# Patient Record
Sex: Female | Born: 1980 | Race: White | Hispanic: No | Marital: Married | Smoking: Former smoker
Health system: Southern US, Community
[De-identification: ages and names within clinical notes are randomized; demographics above are authoritative.]

## PROBLEM LIST (undated history)

## (undated) ENCOUNTER — Inpatient Hospital Stay (HOSPITAL_COMMUNITY): Payer: Self-pay

## (undated) DIAGNOSIS — Q059 Spina bifida, unspecified: Secondary | ICD-10-CM

## (undated) DIAGNOSIS — I10 Essential (primary) hypertension: Secondary | ICD-10-CM

## (undated) HISTORY — DX: Spina bifida, unspecified: Q05.9

---

## 2000-09-29 ENCOUNTER — Emergency Department (HOSPITAL_COMMUNITY): Admission: EM | Admit: 2000-09-29 | Discharge: 2000-09-29 | Payer: Self-pay | Admitting: Emergency Medicine

## 2003-10-14 ENCOUNTER — Other Ambulatory Visit: Admission: RE | Admit: 2003-10-14 | Discharge: 2003-10-14 | Payer: Self-pay | Admitting: Obstetrics and Gynecology

## 2004-03-24 ENCOUNTER — Ambulatory Visit: Payer: Self-pay | Admitting: Gastroenterology

## 2004-04-07 ENCOUNTER — Inpatient Hospital Stay (HOSPITAL_COMMUNITY): Admission: AD | Admit: 2004-04-07 | Discharge: 2004-04-07 | Payer: Self-pay | Admitting: Obstetrics and Gynecology

## 2004-04-27 ENCOUNTER — Inpatient Hospital Stay (HOSPITAL_COMMUNITY): Admission: AD | Admit: 2004-04-27 | Discharge: 2004-04-29 | Payer: Self-pay | Admitting: Obstetrics and Gynecology

## 2004-04-30 ENCOUNTER — Encounter: Admission: RE | Admit: 2004-04-30 | Discharge: 2004-05-30 | Payer: Self-pay | Admitting: Obstetrics and Gynecology

## 2005-12-27 ENCOUNTER — Inpatient Hospital Stay (HOSPITAL_COMMUNITY): Admission: AD | Admit: 2005-12-27 | Discharge: 2005-12-28 | Payer: Self-pay | Admitting: Obstetrics and Gynecology

## 2005-12-28 ENCOUNTER — Inpatient Hospital Stay (HOSPITAL_COMMUNITY): Admission: AD | Admit: 2005-12-28 | Discharge: 2005-12-28 | Payer: Self-pay | Admitting: Obstetrics and Gynecology

## 2006-01-25 ENCOUNTER — Inpatient Hospital Stay (HOSPITAL_COMMUNITY): Admission: AD | Admit: 2006-01-25 | Discharge: 2006-01-25 | Payer: Self-pay | Admitting: Obstetrics & Gynecology

## 2006-02-02 ENCOUNTER — Inpatient Hospital Stay (HOSPITAL_COMMUNITY): Admission: RE | Admit: 2006-02-02 | Discharge: 2006-02-04 | Payer: Self-pay | Admitting: *Deleted

## 2009-06-10 ENCOUNTER — Ambulatory Visit (HOSPITAL_COMMUNITY): Admission: RE | Admit: 2009-06-10 | Discharge: 2009-06-10 | Payer: Self-pay | Admitting: Psychiatry

## 2009-06-16 ENCOUNTER — Other Ambulatory Visit (HOSPITAL_COMMUNITY): Admission: RE | Admit: 2009-06-16 | Discharge: 2009-06-28 | Payer: Self-pay | Admitting: Psychiatry

## 2009-06-22 ENCOUNTER — Ambulatory Visit: Payer: Self-pay | Admitting: Psychiatry

## 2010-08-26 NOTE — Discharge Summary (Signed)
NAMEEBANY, BOWERMASTER                ACCOUNT NO.:  [id number]   MEDICAL RECORD NO.:  [id number]          PATIENT TYPE:  MAT   LOCATION:  [facility]                          FACILITY:  [facility]   PHYSICIAN:  Richardean Sale, M.D.   DATE OF BIRTH:  May 08, 1980   DATE OF ADMISSION:  12/27/2005  DATE OF DISCHARGE:                                 DISCHARGE SUMMARY   CHIEF COMPLAINT:  Contractions.   HISTORY OF PRESENT ILLNESS:  This is a 30 year old gravida 2 para 1, white  female who is at [redacted] weeks gestation with a due date of February 07, 2006,  whose pregnancy, up until this point, has been uncomplicated. She notes the  onset of contractions at approximately 4:00 p.m. today that were occurring  every 5 minutes. She denied any vaginal bleeding, loss of fluid, and  reported good fetal movement.   PAST OBSTETRIC HISTORY:  Significant for hypertension and she was induced at  37-1/2 weeks. Prenatal care has been on [facility] with Dr. Earlene Plater as  primary attending. Vaginal deliver at 37-1/2 weeks, induced for mild pre-  eclampsia.   GYNECOLOGIC HISTORY:  History of cervical dysplasia. No treatment needed. No  other STD's.   PAST MEDICAL HISTORY:  No prior hospitalizations except pregnancy, history  of migraines.   PAST SURGICAL HISTORY:  None.   SOCIAL HISTORY:  She is a former smoker. Smokes occasionally this pregnancy.  Denies any illicit drug use.   FAMILY HISTORY:  Positive for heart disease, hypertension, and emphysema. No  congenital birth defects.   MEDICATIONS:  Prenatal vitamins and Benadryl p.r.n.   ALLERGIES:  NO KNOWN DRUG ALLERGIES.   PHYSICAL EXAMINATION:  VITAL SIGNS:  She is afebrile. Vital signs are  stable.  LUNGS:  Respirations unlabored.  HEART:  Pulse regular.  GENERAL:  A well developed, well nourished female in no acute distress.  ABDOMEN:  Gravida, soft, non-tender.  PELVIC:  Cervix is 2 to 3 cm dilated, 60% effaced, minus 2 station.  Ballotable, vertex.   LABORATORY DATA:  Urine shows 40 ketones, many squamous epithelial cells.  White count of 12.7. Hemoglobin and hematocrit 11.9 and 34. Platelets are  230,000. Fetal fibronectin on admission was negative. Group B beta strep is  pending.   Fetal heart rate tracing is reactive. Tocometer tracing shows contractions  initially every 2 minutes, then spaced out every 2 to 5 minutes, since  administration of Procardia. Cervix has been unchanged since arrival at MAU  at approximately 6:00 p.m.   ASSESSMENT:  A 30 year old gravida 2, para 1, white female at [redacted] weeks  gestation with pre-term contractions.   PLAN:  The patient has received 2 doses of Procardia with only slight  decrease in the frequency of her contractions. The patient states  contractions now are 3 out of 10 on the pain scale. Previously, they were 6  out of 10. Will administer a dose of terbutaline and monitor for another  hour. If contractions continue despite terbutaline, will admit overnight for  observation. A group beta strep is pending. She has been given 1 dose of  ampicillin 2 grams IV. We will continue if she is admitted overnight. Given  that she is only [redacted] weeks gestation, she has received 1 dose of beta  Methasone tonight and will plan to repeat in 24 hours. If patient's  contractions decrease in frequency, will plan to send her home with followup  in the office in 24 to 48 hours.      Richardean Sale, M.D.  Electronically Signed     JW/MEDQ  D:  12/27/2005  T:  12/29/2005  Job:  [id number]

## 2012-04-10 NOTE — L&D Delivery Note (Signed)
Delivery Note After a 5  Minute 2nd stage, At 3:01 AM a viable female was delivered via Vaginal, Spontaneous Delivery (Presentation: ; Occiput Anterior).  APGAR: 9/9; weight pending Placenta status: Intact, Spontaneous.  Cord: 3 vessels with the following complications: None.    Anesthesia: Epidural  Episiotomy: None Lacerations: 2nd degree;Perineal Suture Repair: 2.0 vicryl Est. Blood Loss (mL): 150  Mom to postpartum.  Baby to nursery-stable.  Delivery and repair by [name] Other, DO under my supervision  CRESENZO-DISHMAN,FRANCES 09/13/2012, 3:27 AM

## 2012-06-26 ENCOUNTER — Emergency Department (HOSPITAL_COMMUNITY)
Admission: EM | Admit: 2012-06-26 | Discharge: 2012-06-26 | Disposition: A | Discharge status: Other Acute Inpatient Hospital | Payer: Self-pay

## 2012-06-26 ENCOUNTER — Encounter (HOSPITAL_COMMUNITY): Payer: Self-pay

## 2012-06-26 ENCOUNTER — Inpatient Hospital Stay (HOSPITAL_COMMUNITY)
Admission: AD | Admit: 2012-06-26 | Discharge: 2012-06-26 | Disposition: A | Discharge status: Home / Self Care | Payer: Medicaid Other | Source: Ambulatory Visit | Attending: Obstetrics & Gynecology | Admitting: Obstetrics & Gynecology

## 2012-06-26 DIAGNOSIS — Z3482 Encounter for supervision of other normal pregnancy, second trimester: Secondary | ICD-10-CM

## 2012-06-26 DIAGNOSIS — O99891 Other specified diseases and conditions complicating pregnancy: Secondary | ICD-10-CM | POA: Insufficient documentation

## 2012-06-26 DIAGNOSIS — R030 Elevated blood-pressure reading, without diagnosis of hypertension: Secondary | ICD-10-CM | POA: Insufficient documentation

## 2012-06-26 DIAGNOSIS — N949 Unspecified condition associated with female genital organs and menstrual cycle: Secondary | ICD-10-CM | POA: Insufficient documentation

## 2012-06-26 DIAGNOSIS — O0930 Supervision of pregnancy with insufficient antenatal care, unspecified trimester: Secondary | ICD-10-CM | POA: Insufficient documentation

## 2012-06-26 HISTORY — DX: Essential (primary) hypertension: I10

## 2012-06-26 LAB — COMPREHENSIVE METABOLIC PANEL
ALT: 8 U/L (ref 0–35)
AST: 15 U/L (ref 0–37)
BUN: 10 mg/dL (ref 6–23)
CO2: 24 mEq/L (ref 19–32)
Calcium: 8.8 mg/dL (ref 8.4–10.5)
Chloride: 100 mEq/L (ref 96–112)
Creatinine, Ser: 0.47 mg/dL — ABNORMAL LOW (ref 0.50–1.10)
GFR calc Af Amer: >90 mL/min (ref >90)
GFR calc non Af Amer: >90 mL/min (ref >90)
Glucose, Bld: 99 mg/dL (ref 70–99)
Potassium: 3.7 mEq/L (ref 3.5–5.1)
Sodium: 134 mEq/L — ABNORMAL LOW (ref 135–145)
Total Bilirubin: 0.2 mg/dL — ABNORMAL LOW (ref 0.3–1.2)
Total Protein: 6.6 g/dL (ref 6.0–8.3)

## 2012-06-26 LAB — CBC
HCT: 36.3 % (ref 36.0–46.0)
Hemoglobin: 12.1 g/dL (ref 12.0–15.0)
MCH: 29 pg (ref 26.0–34.0)
MCHC: 33.3 g/dL (ref 30.0–36.0)
MCV: 87.1 fL (ref 78.0–100.0)
Platelets: 240 10*3/uL (ref 150–400)
RBC: 4.17 MIL/uL (ref 3.87–5.11)
WBC: 10.5 10*3/uL (ref 4.0–10.5)

## 2012-06-26 LAB — DIFFERENTIAL
Basophils Absolute: 0 10*3/uL (ref 0.0–0.1)
Basophils Relative: 0 % (ref 0–1)
Eosinophils Absolute: 0.1 10*3/uL (ref 0.0–0.7)
Eosinophils Relative: 1 % (ref 0–5)
Lymphocytes Relative: 17 % (ref 12–46)
Lymphs Abs: 1.8 10*3/uL (ref 0.7–4.0)
Monocytes Relative: 7 % (ref 3–12)
Neutro Abs: 7.9 10*3/uL — ABNORMAL HIGH (ref 1.7–7.7)
Neutrophils Relative %: 75 % (ref 43–77)

## 2012-06-26 LAB — RAPID URINE DRUG SCREEN, HOSP PERFORMED
Amphetamines: NOT DETECTED
Barbiturates: NOT DETECTED
Benzodiazepines: NOT DETECTED
Cocaine: NOT DETECTED
Tetrahydrocannabinol: NOT DETECTED

## 2012-06-26 LAB — ABO/RH: ABO/RH(D): A POS

## 2012-06-26 LAB — TYPE AND SCREEN
ABO/RH(D): A POS
Antibody Screen: NEGATIVE

## 2012-06-26 LAB — PROTEIN / CREATININE RATIO, URINE
Creatinine, Urine: 193.28 mg/dL
Protein Creatinine Ratio: 0.12 (ref 0.00–0.15)

## 2012-06-26 LAB — RAPID HIV SCREEN (WH-MAU): Rapid HIV Screen: NONREACTIVE

## 2012-06-26 MED ORDER — LABETALOL HCL 100 MG PO TABS
200.0000 mg | ORAL_TABLET | Freq: Once | ORAL | Status: AC
  Administered 2012-06-26: 200 mg via ORAL
  Filled 2012-06-26: qty 2

## 2012-06-26 MED ORDER — LABETALOL HCL 100 MG PO TABS: 100.0000 mg | ORAL_TABLET | Freq: Two times a day (BID) | ORAL | Status: DC

## 2012-06-26 NOTE — MAU Note (Signed)
Patient states she had a positive pregnancy test in January at [facility] and has applied for [facility]. States she has some vaginal discharge in the am but does not continue to leak during the day. Is not having pain or bleeding. Patient is concerned about not having prenatal care. Has felt fetal movement.

## 2012-06-26 NOTE — MAU Note (Signed)
Patient is taking Effexor and has slowly weaned herself off but continued to take some. Had a MMR vaccine in September and is concerned about taking it and becoming pregnant. Patient had gestational hypertension with the first pregnancy and had IOL at 37 weeks.

## 2012-06-26 NOTE — MAU Provider Note (Signed)
History     CSN: [id number]  Arrival date and time: 06/26/12 1732   None     Chief Complaint  Patient presents with  . Vaginal Discharge   HPI 32 y/o G3P2002 here with no pre-natal care without major complaint. She states that her LMP was about 8 months ago but that she has had very irregular periods since she had an IUD removed last year. She has Hx of gestational HTN or pre-eclampsia in a previous pregnancy when she was induced at 37 weeks. She denies vaginal discharge/bleeding, decreased fetal movements, and contractions. 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   She has had a period of crampy abd pain followed by back pain suspicious for a contraction last week but that has not recurred. She also notes some possible leakage of fluid for approx 1 month. She denies any gushes of fluid and has not needed to use a pad during this time. She says the fluid is clear and runs down her legs at times. She's also had some intermittent dyspnea with activity that is not alarming to her. It happens with vigorous exercise and returns to baseline easily when she rests. She also has occasional RUQ pain. She does have occasional migraine headaches which is normal for her and relieved with tylenol.   She denies other medical problems except for possible chronic hypertension. She says taht at a physical in September it was elevated but she doesn't recall the numbers. She denies headache, scotoma, chest pain, and edema.    OB History   Grav Para Term Preterm Abortions TAB SAB Ect Mult Living   3 2 2       2       Past Medical History  Diagnosis Date  . Hypertension     1st pregnancy for preeclampsia    No past surgical history on file.  No family history on file.  History  Substance Use Topics  . Smoking status: Never Smoker   . Smokeless tobacco: Not on file  . Alcohol Use: No    Allergies: Allergies not on  file  No prescriptions prior to admission    ROS Per HPI Physical Exam   Blood pressure 140/101, pulse 108, temperature 98.5 F (36.9 C), temperature source Oral, resp. rate 16, height 5\' 5"  (1.651 m), weight 73.029 kg (161 lb), SpO2 100.00%.  Physical Exam Physical Exam  Gen: NAD, alert, cooperative with exam HEENT: NCAT, MMM CV: RRR, good S1/S2, no murmur Resp: CTABL, no wheezes, non-labored Abd: Soft pregnant abdomen, fundus measuring 27 cm, non-tender to palpation Ext: No edema Neuro: Alert and oriented, No gross deficits Spec exam without pooling or blood, cervix closed.   FHT: baseline: 135, moderate variability, accels present, no decels Toco: no contractions seen   MAU Course  Procedures  Results for orders placed during the hospital encounter of 06/26/12 (from the past 24 hour(s))  URINE RAPID DRUG SCREEN (HOSP PERFORMED)     Status: None   Collection Time    06/26/12  6:00 PM      Result Value Range   Opiates NONE DETECTED  NONE DETECTED   Cocaine NONE DETECTED  NONE DETECTED   Benzodiazepines NONE DETECTED  NONE DETECTED   Amphetamines NONE DETECTED  NONE DETECTED   Tetrahydrocannabinol NONE DETECTED  NONE DETECTED   Barbiturates NONE DETECTED  NONE DETECTED  PROTEIN / CREATININE RATIO, URINE  Status: None   Collection Time    06/26/12  6:00 PM      Result Value Range   Creatinine, Urine 193.28     Total Protein, Urine 23.1     PROTEIN CREATININE RATIO 0.12  0.00 - 0.15  COMPREHENSIVE METABOLIC PANEL     Status: Abnormal   Collection Time    06/26/12  6:53 PM      Result Value Range   Sodium 134 (*) 135 - 145 mEq/L   Potassium 3.7  3.5 - 5.1 mEq/L   Chloride 100  96 - 112 mEq/L   CO2 24  19 - 32 mEq/L   Glucose, Bld 99  70 - 99 mg/dL   BUN 10  6 - 23 mg/dL   Creatinine, Ser 0.98 (*) 0.50 - 1.10 mg/dL   Calcium 8.8  8.4 - 11.9 mg/dL   Total Protein 6.6  6.0 - 8.3 g/dL   Albumin 2.6 (*) 3.5 - 5.2 g/dL   AST 15  0 - 37 U/L   ALT 8  0 - 35 U/L    Alkaline Phosphatase 95  39 - 117 U/L   Total Bilirubin 0.2 (*) 0.3 - 1.2 mg/dL   GFR calc non Af Amer >90  >90 mL/min   GFR calc Af Amer >90  >90 mL/min  CBC     Status: None   Collection Time    06/26/12  6:53 PM      Result Value Range   WBC 10.5  4.0 - 10.5 K/uL   RBC 4.17  3.87 - 5.11 MIL/uL   Hemoglobin 12.1  12.0 - 15.0 g/dL   HCT 14.7  82.9 - 56.2 %   MCV 87.1  78.0 - 100.0 fL   MCH 29.0  26.0 - 34.0 pg   MCHC 33.3  30.0 - 36.0 g/dL   RDW 13.0  86.5 - 78.4 %   Platelets 240  150 - 400 K/uL  DIFFERENTIAL     Status: Abnormal   Collection Time    06/26/12  6:53 PM      Result Value Range   Neutrophils Relative 75  43 - 77 %   Neutro Abs 7.9 (*) 1.7 - 7.7 K/uL   Lymphocytes Relative 17  12 - 46 %   Lymphs Abs 1.8  0.7 - 4.0 K/uL   Monocytes Relative 7  3 - 12 %   Monocytes Absolute 0.7  0.1 - 1.0 K/uL   Eosinophils Relative 1  0 - 5 %   Eosinophils Absolute 0.1  0.0 - 0.7 K/uL   Basophils Relative 0  0 - 1 %   Basophils Absolute 0.0  0.0 - 0.1 K/uL  TYPE AND SCREEN     Status: None   Collection Time    06/26/12  6:53 PM      Result Value Range   ABO/RH(D) A POS     Antibody Screen NEG     Sample Expiration 06/29/2012    RAPID HIV SCREEN [facility]     Status: None   Collection Time    06/26/12  6:53 PM      Result Value Range   SUDS Rapid HIV Screen NON REACTIVE  NON REACTIVE  ABO/RH     Status: None   Collection Time    06/26/12  6:55 PM      Result Value Range   ABO/RH(D) A POS       Assessment and Plan  32 y/o G3P2002 here  with no pre-natal care at approximately 27 weeks by fundal height - She presents with elevated blood pressures questionable for chronic hypertension. PIH labs obatined and are negative (h/o pre-eclampsia) - Complete OB US ordered OP - OB panel, UDS, CBC, CMP, GCC urine collected and negative - Message sent to schedule f/u in [facility]  Kevin Fenton 06/26/2012, 6:53 PM  Evaluation and management procedures were performed by Resident  physician under my supervision/collaboration. Chart reviewed, patient examined by me and I agree with management and plan. Filed Vitals:   06/26/12 2016 06/26/12 2031 06/26/12 2046 06/26/12 2104  BP: 128/90 120/85 115/78 112/73  Pulse: 84 90 79 83  Temp:      TempSrc:      Resp: 20   20  Height:      Weight:      SpO2:

## 2012-06-27 LAB — GC/CHLAMYDIA PROBE AMP
CT Probe RNA: NEGATIVE
GC Probe RNA: NEGATIVE

## 2012-06-27 LAB — RPR: RPR Ser Ql: NONREACTIVE

## 2012-06-27 LAB — HEPATITIS B SURFACE ANTIGEN: Hepatitis B Surface Ag: NEGATIVE

## 2012-06-28 LAB — RUBELLA SCREEN: Rubella: 3.99 Index — ABNORMAL HIGH (ref <0.90)

## 2012-07-02 ENCOUNTER — Ambulatory Visit (HOSPITAL_COMMUNITY)
Admission: RE | Admit: 2012-07-02 | Discharge: 2012-07-02 | Disposition: A | Discharge status: Home / Self Care | Payer: Medicaid Other | Source: Ambulatory Visit | Attending: Obstetrics & Gynecology | Admitting: Obstetrics & Gynecology

## 2012-07-02 DIAGNOSIS — Z3689 Encounter for other specified antenatal screening: Secondary | ICD-10-CM | POA: Insufficient documentation

## 2012-07-02 DIAGNOSIS — O132 Gestational [pregnancy-induced] hypertension without significant proteinuria, second trimester: Secondary | ICD-10-CM

## 2012-07-02 DIAGNOSIS — Z3482 Encounter for supervision of other normal pregnancy, second trimester: Secondary | ICD-10-CM

## 2012-07-02 DIAGNOSIS — O0930 Supervision of pregnancy with insufficient antenatal care, unspecified trimester: Secondary | ICD-10-CM | POA: Insufficient documentation

## 2012-07-11 ENCOUNTER — Encounter: Payer: Self-pay | Admitting: Family

## 2012-07-11 ENCOUNTER — Ambulatory Visit (INDEPENDENT_AMBULATORY_CARE_PROVIDER_SITE_OTHER): Payer: Self-pay | Admitting: Family

## 2012-07-11 ENCOUNTER — Other Ambulatory Visit: Payer: Self-pay | Admitting: Family

## 2012-07-11 VITALS — BP 123/82 | Temp 97.3°F | Wt 160.9 lb

## 2012-07-11 DIAGNOSIS — O0990 Supervision of high risk pregnancy, unspecified, unspecified trimester: Secondary | ICD-10-CM | POA: Insufficient documentation

## 2012-07-11 DIAGNOSIS — O0930 Supervision of pregnancy with insufficient antenatal care, unspecified trimester: Secondary | ICD-10-CM

## 2012-07-11 DIAGNOSIS — O0933 Supervision of pregnancy with insufficient antenatal care, third trimester: Secondary | ICD-10-CM

## 2012-07-11 DIAGNOSIS — O10019 Pre-existing essential hypertension complicating pregnancy, unspecified trimester: Secondary | ICD-10-CM

## 2012-07-11 DIAGNOSIS — Z3493 Encounter for supervision of normal pregnancy, unspecified, third trimester: Secondary | ICD-10-CM

## 2012-07-11 DIAGNOSIS — O10913 Unspecified pre-existing hypertension complicating pregnancy, third trimester: Secondary | ICD-10-CM

## 2012-07-11 DIAGNOSIS — Z3480 Encounter for supervision of other normal pregnancy, unspecified trimester: Secondary | ICD-10-CM

## 2012-07-11 DIAGNOSIS — O139 Gestational [pregnancy-induced] hypertension without significant proteinuria, unspecified trimester: Secondary | ICD-10-CM | POA: Insufficient documentation

## 2012-07-11 LAB — POCT URINALYSIS DIP (DEVICE)
Bilirubin Urine: NEGATIVE
Glucose, UA: NEGATIVE mg/dL
Hgb urine dipstick: NEGATIVE
Ketones, ur: NEGATIVE mg/dL
Leukocytes, UA: NEGATIVE
Nitrite: NEGATIVE
Protein, ur: 30 mg/dL — AB
Specific Gravity, Urine: 1.02 (ref 1.005–1.030)
Urobilinogen, UA: 0.2 mg/dL (ref 0.0–1.0)
pH: 7 (ref 5.0–8.0)

## 2012-07-11 MED ORDER — ONDANSETRON 8 MG PO TBDP: 8.0000 mg | ORAL_TABLET | Freq: Three times a day (TID) | ORAL | Status: DC | PRN

## 2012-07-11 NOTE — Progress Notes (Signed)
New OB visit; reviewed practice information and OB visit schedule; too late for genetic screening; seen in MAU and put on labetalol for elevated blood pressures.  History of preeclampsia in 1st pregnancy.  Reviewed ultrasound results (LV echogenic focus).  Pt will collect 24 hour urine and return for prenatal labs on Monday.   Pap smear collected today. Exam   BP 123/82  Temp(Src) 97.3 F (36.3 C)  Wt 160 lb 14.4 oz (72.984 kg)  BMI 26.78 kg/m2  LMP 10/03/2011 Uterine Size: size equals dates  Pelvic Exam:    Perineum: No Hemorrhoids, Normal Perineum   Vulva: normal   Vagina:  normal mucosa, normal discharge, no palpable nodules   pH: Not done   Cervix: no bleeding following Pap, no cervical motion tenderness and no lesions   Adnexa: normal adnexa and no mass, fullness, tenderness   Bony Pelvis: Adequate  System: Breast:  No nipple retraction or dimpling, No nipple discharge or bleeding, No axillary or supraclavicular adenopathy, Normal to palpation without dominant masses   Skin: normal coloration and turgor, no rashes    Neurologic: negative   Extremities: normal strength, tone, and muscle mass   HEENT neck supple with midline trachea and thyroid without masses   Mouth/Teeth mucous membranes moist, pharynx normal without lesions   Neck supple and no masses   Cardiovascular: regular rate and rhythm, no murmurs or gallops   Respiratory:  appears well, vitals normal, no respiratory distress, acyanotic, normal RR, neck free of mass or lymphadenopathy, chest clear, no wheezing, crepitations, rhonchi, normal symmetric air entry   Abdomen: soft, non-tender; bowel sounds normal; no masses,  no organomegaly   Urinary: urethral meatus normal

## 2012-07-13 ENCOUNTER — Encounter: Payer: Self-pay | Admitting: Family

## 2012-07-13 LAB — CULTURE, OB URINE

## 2012-07-15 ENCOUNTER — Other Ambulatory Visit: Payer: Self-pay

## 2012-07-15 LAB — COMPREHENSIVE METABOLIC PANEL
ALT: 8 U/L (ref 0–35)
Alkaline Phosphatase: 94 U/L (ref 39–117)
CO2: 28 mEq/L (ref 19–32)
Creat: 0.55 mg/dL (ref 0.50–1.10)
Total Bilirubin: 0.4 mg/dL (ref 0.3–1.2)

## 2012-07-15 NOTE — Addendum Note (Signed)
Addended by: Franchot Mimes on: 07/15/2012 03:40 PM   Modules accepted: Orders

## 2012-07-16 LAB — CREATININE CLEARANCE, URINE, 24 HOUR
Creatinine Clearance: 161 mL/min — ABNORMAL HIGH (ref 75–115)
Creatinine, Urine: 145.9 mg/dL
Creatinine: 0.55 mg/dL (ref 0.50–1.10)

## 2012-07-16 LAB — PROTEIN, URINE, 24 HOUR: Protein, 24H Urine: 70 mg/d (ref 50–100)

## 2012-07-17 ENCOUNTER — Encounter: Payer: Self-pay | Admitting: Family

## 2012-07-25 ENCOUNTER — Encounter: Payer: Self-pay | Admitting: Obstetrics and Gynecology

## 2012-08-01 ENCOUNTER — Encounter: Payer: Self-pay | Admitting: Obstetrics & Gynecology

## 2012-08-08 ENCOUNTER — Ambulatory Visit (INDEPENDENT_AMBULATORY_CARE_PROVIDER_SITE_OTHER): Payer: Medicaid Other | Admitting: Obstetrics & Gynecology

## 2012-08-08 VITALS — BP 124/81 | Temp 97.9°F | Wt 169.7 lb

## 2012-08-08 DIAGNOSIS — O0993 Supervision of high risk pregnancy, unspecified, third trimester: Secondary | ICD-10-CM

## 2012-08-08 DIAGNOSIS — O10019 Pre-existing essential hypertension complicating pregnancy, unspecified trimester: Secondary | ICD-10-CM

## 2012-08-08 DIAGNOSIS — O10913 Unspecified pre-existing hypertension complicating pregnancy, third trimester: Secondary | ICD-10-CM

## 2012-08-08 LAB — POCT URINALYSIS DIP (DEVICE)
Bilirubin Urine: NEGATIVE
Glucose, UA: NEGATIVE mg/dL
Hgb urine dipstick: NEGATIVE
Specific Gravity, Urine: <=1.005 (ref 1.005–1.030)
Urobilinogen, UA: 0.2 mg/dL (ref 0.0–1.0)

## 2012-08-08 NOTE — Progress Notes (Signed)
Pt had NR NST @ 1330 w/FHR variables. She was advised of the importance of continued EFM for further evaluation. She stated that she could not stay because she needed to pick up her children from school and husband from work. She agreed to return later today for repeat NST.  Pt returned @ 1600 and had reactive NST.

## 2012-08-08 NOTE — Progress Notes (Signed)
P= c/o trace edema in ankles only, pelvic pressure, states hasn't taken bp medicine yet today but took while at check in. Declines flu shot.

## 2012-08-08 NOTE — Patient Instructions (Signed)

## 2012-08-08 NOTE — Progress Notes (Signed)
On labetalol 100 mg BID, c/o pain and pressure, no UC, good FM. BP noted, labs were wnl.  Ned to start testing 2/ week, have f/u US

## 2012-08-09 NOTE — Progress Notes (Signed)
08/08/12 reactive NST

## 2012-08-12 ENCOUNTER — Other Ambulatory Visit: Payer: Self-pay

## 2012-08-12 ENCOUNTER — Ambulatory Visit (HOSPITAL_COMMUNITY): Payer: Medicaid Other | Attending: Obstetrics & Gynecology

## 2012-08-14 ENCOUNTER — Encounter: Payer: Self-pay | Admitting: *Deleted

## 2012-08-15 ENCOUNTER — Ambulatory Visit (INDEPENDENT_AMBULATORY_CARE_PROVIDER_SITE_OTHER): Payer: Medicaid Other | Admitting: Obstetrics & Gynecology

## 2012-08-15 VITALS — BP 133/85 | Temp 97.0°F | Wt 169.3 lb

## 2012-08-15 DIAGNOSIS — O10019 Pre-existing essential hypertension complicating pregnancy, unspecified trimester: Secondary | ICD-10-CM

## 2012-08-15 DIAGNOSIS — O10913 Unspecified pre-existing hypertension complicating pregnancy, third trimester: Secondary | ICD-10-CM

## 2012-08-15 DIAGNOSIS — O10013 Pre-existing essential hypertension complicating pregnancy, third trimester: Secondary | ICD-10-CM

## 2012-08-15 LAB — POCT URINALYSIS DIP (DEVICE)
Bilirubin Urine: NEGATIVE
Hgb urine dipstick: NEGATIVE
Ketones, ur: NEGATIVE mg/dL
pH: 5.5 (ref 5.0–8.0)

## 2012-08-15 MED ORDER — LABETALOL HCL 100 MG PO TABS: 100.0000 mg | ORAL_TABLET | Freq: Three times a day (TID) | ORAL | Status: DC

## 2012-08-15 MED ORDER — PANTOPRAZOLE SODIUM 40 MG PO TBEC: 40.0000 mg | DELAYED_RELEASE_TABLET | Freq: Every day | ORAL | Status: DC

## 2012-08-15 NOTE — Progress Notes (Signed)
Pt missed appt on 5/5 for follow up US growth- rescheduled to 08/19/12.

## 2012-08-15 NOTE — Progress Notes (Signed)
P= 82,  

## 2012-08-15 NOTE — Patient Instructions (Signed)

## 2012-08-15 NOTE — Progress Notes (Signed)
Nausea and occasional vomiting but denies reflux. Will try increasing labetalol as well to TID  NST reaactive today, UC Q 3-4 min, cs closed

## 2012-08-19 ENCOUNTER — Ambulatory Visit (HOSPITAL_COMMUNITY)
Admission: RE | Admit: 2012-08-19 | Discharge: 2012-08-19 | Disposition: A | Discharge status: Home / Self Care | Payer: Medicaid Other | Source: Ambulatory Visit | Attending: Obstetrics & Gynecology | Admitting: Obstetrics & Gynecology

## 2012-08-19 ENCOUNTER — Ambulatory Visit (INDEPENDENT_AMBULATORY_CARE_PROVIDER_SITE_OTHER): Payer: Medicaid Other | Admitting: *Deleted

## 2012-08-19 VITALS — BP 130/83

## 2012-08-19 DIAGNOSIS — O10013 Pre-existing essential hypertension complicating pregnancy, third trimester: Secondary | ICD-10-CM

## 2012-08-19 DIAGNOSIS — Z3689 Encounter for other specified antenatal screening: Secondary | ICD-10-CM | POA: Insufficient documentation

## 2012-08-19 DIAGNOSIS — O10019 Pre-existing essential hypertension complicating pregnancy, unspecified trimester: Secondary | ICD-10-CM

## 2012-08-19 DIAGNOSIS — O0993 Supervision of high risk pregnancy, unspecified, third trimester: Secondary | ICD-10-CM

## 2012-08-19 DIAGNOSIS — O139 Gestational [pregnancy-induced] hypertension without significant proteinuria, unspecified trimester: Secondary | ICD-10-CM | POA: Insufficient documentation

## 2012-08-19 NOTE — Progress Notes (Signed)
P = 80   [place] for growth today

## 2012-08-19 NOTE — Progress Notes (Signed)
NST performed today was reviewed and was found to be reactive.  Continue recommended antenatal testing and prenatal care.  

## 2012-08-22 ENCOUNTER — Ambulatory Visit (INDEPENDENT_AMBULATORY_CARE_PROVIDER_SITE_OTHER): Payer: Medicaid Other | Admitting: Obstetrics & Gynecology

## 2012-08-22 VITALS — BP 123/83 | Temp 98.7°F | Wt 171.6 lb

## 2012-08-22 DIAGNOSIS — O10019 Pre-existing essential hypertension complicating pregnancy, unspecified trimester: Secondary | ICD-10-CM

## 2012-08-22 LAB — POCT URINALYSIS DIP (DEVICE)
Bilirubin Urine: NEGATIVE
Hgb urine dipstick: NEGATIVE
Nitrite: NEGATIVE
Protein, ur: 30 mg/dL — AB
pH: 6 (ref 5.0–8.0)

## 2012-08-22 MED ORDER — AMLODIPINE BESYLATE 5 MG PO TABS: 5.0000 mg | ORAL_TABLET | Freq: Every day | ORAL | Status: DC

## 2012-08-22 NOTE — Patient Instructions (Addendum)

## 2012-08-22 NOTE — Progress Notes (Signed)
NST reactive, irregular contractions  Nausea and emesis, seems to be related to her labetalol dose. Headache as well. Will switch to Norvasc 5 mg daily,

## 2012-08-22 NOTE — Progress Notes (Signed)
Pulse- 80 

## 2012-08-26 ENCOUNTER — Other Ambulatory Visit: Payer: Medicaid Other

## 2012-08-29 ENCOUNTER — Encounter: Payer: Self-pay | Admitting: Obstetrics and Gynecology

## 2012-08-29 ENCOUNTER — Ambulatory Visit (INDEPENDENT_AMBULATORY_CARE_PROVIDER_SITE_OTHER): Payer: Medicaid Other | Admitting: Obstetrics and Gynecology

## 2012-08-29 VITALS — BP 131/87 | Wt 171.9 lb

## 2012-08-29 DIAGNOSIS — O10013 Pre-existing essential hypertension complicating pregnancy, third trimester: Secondary | ICD-10-CM

## 2012-08-29 DIAGNOSIS — O0930 Supervision of pregnancy with insufficient antenatal care, unspecified trimester: Secondary | ICD-10-CM

## 2012-08-29 DIAGNOSIS — O10019 Pre-existing essential hypertension complicating pregnancy, unspecified trimester: Secondary | ICD-10-CM

## 2012-08-29 DIAGNOSIS — O0993 Supervision of high risk pregnancy, unspecified, third trimester: Secondary | ICD-10-CM

## 2012-08-29 DIAGNOSIS — O0933 Supervision of pregnancy with insufficient antenatal care, third trimester: Secondary | ICD-10-CM

## 2012-08-29 LAB — POCT URINALYSIS DIP (DEVICE)
Nitrite: NEGATIVE
Protein, ur: 30 mg/dL — AB

## 2012-08-29 LAB — OB RESULTS CONSOLE GC/CHLAMYDIA: Chlamydia: NEGATIVE

## 2012-08-29 NOTE — Progress Notes (Signed)
NST reviewed and reactive. Patient doing well without complaints. BP ok today. FM/PTL precautions reviewed. Cultures collected today

## 2012-08-29 NOTE — Progress Notes (Signed)
P = 88   Pt missed appt on 5/19 for fetal testing due to car trouble. States she lost her mucous plug recently and having low abd pain- ? UC's.  Pt denies H/A or visual disturbances today and reports less nausea than last week.  Pt states she has been weaning herself off of effexor because she heard that it was bad for the baby.

## 2012-09-01 ENCOUNTER — Encounter: Payer: Self-pay | Admitting: Obstetrics and Gynecology

## 2012-09-01 LAB — CULTURE, BETA STREP (GROUP B ONLY)

## 2012-09-03 ENCOUNTER — Ambulatory Visit (INDEPENDENT_AMBULATORY_CARE_PROVIDER_SITE_OTHER): Payer: Medicaid Other | Admitting: *Deleted

## 2012-09-03 VITALS — BP 126/87

## 2012-09-03 DIAGNOSIS — O10013 Pre-existing essential hypertension complicating pregnancy, third trimester: Secondary | ICD-10-CM

## 2012-09-03 DIAGNOSIS — O10019 Pre-existing essential hypertension complicating pregnancy, unspecified trimester: Secondary | ICD-10-CM

## 2012-09-03 NOTE — Progress Notes (Signed)
P = 92 

## 2012-09-05 ENCOUNTER — Encounter: Payer: Self-pay | Admitting: Obstetrics & Gynecology

## 2012-09-05 ENCOUNTER — Ambulatory Visit (INDEPENDENT_AMBULATORY_CARE_PROVIDER_SITE_OTHER): Payer: Medicaid Other | Admitting: Obstetrics & Gynecology

## 2012-09-05 VITALS — BP 131/89 | Wt 176.2 lb

## 2012-09-05 DIAGNOSIS — O139 Gestational [pregnancy-induced] hypertension without significant proteinuria, unspecified trimester: Secondary | ICD-10-CM

## 2012-09-05 DIAGNOSIS — O133 Gestational [pregnancy-induced] hypertension without significant proteinuria, third trimester: Secondary | ICD-10-CM

## 2012-09-05 LAB — POCT URINALYSIS DIP (DEVICE)
Bilirubin Urine: NEGATIVE
Glucose, UA: NEGATIVE mg/dL
Hgb urine dipstick: NEGATIVE
Ketones, ur: NEGATIVE mg/dL
Nitrite: NEGATIVE
Protein, ur: NEGATIVE mg/dL
Specific Gravity, Urine: >=1.03 (ref 1.005–1.030)
Urobilinogen, UA: 0.2 mg/dL (ref 0.0–1.0)
pH: 6 (ref 5.0–8.0)

## 2012-09-05 NOTE — Progress Notes (Signed)
P-100 

## 2012-09-05 NOTE — Progress Notes (Signed)
NST reactive. Continue twice a week testing. Contractions usually at least 5-6x each hour every hour but able to talk through them and sleep through them. No other complaints. Compliant with norvasc and BP well controlled today. Continues to have same headaches has had throughout pregnancy (top of the head). No blurry vision, RUQ pain, shortness of breath. Patient requesting induction but discussed waiting until 39 weeks if BP remains well controlled. Weight gain likely related to recent memorial day weekend and some high salt foods.

## 2012-09-05 NOTE — Progress Notes (Signed)
Pt is concerned about having a 5 lb weight gain in 1 week, however she has had total weight gain of 6 lbs over 4 weeks.

## 2012-09-05 NOTE — Progress Notes (Signed)
NST reviewed and reactive.  Kara Conner, M.D., FACOG    

## 2012-09-05 NOTE — Patient Instructions (Signed)

## 2012-09-09 ENCOUNTER — Ambulatory Visit (INDEPENDENT_AMBULATORY_CARE_PROVIDER_SITE_OTHER): Payer: Medicaid Other | Admitting: *Deleted

## 2012-09-09 VITALS — BP 137/88 | Wt 175.7 lb

## 2012-09-09 DIAGNOSIS — O133 Gestational [pregnancy-induced] hypertension without significant proteinuria, third trimester: Secondary | ICD-10-CM

## 2012-09-09 DIAGNOSIS — O139 Gestational [pregnancy-induced] hypertension without significant proteinuria, unspecified trimester: Secondary | ICD-10-CM

## 2012-09-09 NOTE — Progress Notes (Signed)
NST reactive on 09/09/12 

## 2012-09-09 NOTE — Progress Notes (Signed)
P = 92  Pt gets H/A every night- relieved by tylenol- denies visual disturbances.

## 2012-09-11 NOTE — Progress Notes (Signed)
NST 09/03/12 reviewed and reactive 

## 2012-09-12 ENCOUNTER — Ambulatory Visit (INDEPENDENT_AMBULATORY_CARE_PROVIDER_SITE_OTHER): Payer: Medicaid Other | Admitting: Obstetrics and Gynecology

## 2012-09-12 ENCOUNTER — Encounter (HOSPITAL_COMMUNITY): Payer: Self-pay | Admitting: Anesthesiology

## 2012-09-12 ENCOUNTER — Encounter (HOSPITAL_COMMUNITY): Payer: Self-pay | Admitting: *Deleted

## 2012-09-12 ENCOUNTER — Inpatient Hospital Stay (HOSPITAL_COMMUNITY)
Admission: AD | Admit: 2012-09-12 | Discharge: 2012-09-14 | DRG: 775 | Disposition: A | Discharge status: Home / Self Care | Payer: Medicaid Other | Source: Ambulatory Visit | Attending: Obstetrics & Gynecology | Admitting: Obstetrics & Gynecology

## 2012-09-12 ENCOUNTER — Encounter: Payer: Self-pay | Admitting: Obstetrics and Gynecology

## 2012-09-12 ENCOUNTER — Inpatient Hospital Stay (HOSPITAL_COMMUNITY): Payer: Medicaid Other | Admitting: Anesthesiology

## 2012-09-12 VITALS — BP 140/98 | Temp 97.6°F | Wt 177.1 lb

## 2012-09-12 DIAGNOSIS — O0993 Supervision of high risk pregnancy, unspecified, third trimester: Secondary | ICD-10-CM

## 2012-09-12 DIAGNOSIS — O0930 Supervision of pregnancy with insufficient antenatal care, unspecified trimester: Secondary | ICD-10-CM

## 2012-09-12 DIAGNOSIS — O133 Gestational [pregnancy-induced] hypertension without significant proteinuria, third trimester: Secondary | ICD-10-CM

## 2012-09-12 DIAGNOSIS — O139 Gestational [pregnancy-induced] hypertension without significant proteinuria, unspecified trimester: Principal | ICD-10-CM | POA: Diagnosis present

## 2012-09-12 DIAGNOSIS — O0933 Supervision of pregnancy with insufficient antenatal care, third trimester: Secondary | ICD-10-CM

## 2012-09-12 LAB — RPR: RPR Ser Ql: NONREACTIVE

## 2012-09-12 LAB — PROTEIN / CREATININE RATIO, URINE
Creatinine, Urine: 164.91 mg/dL
Total Protein, Urine: 17.2 mg/dL

## 2012-09-12 LAB — POCT URINALYSIS DIP (DEVICE)
Bilirubin Urine: NEGATIVE
Nitrite: NEGATIVE
Protein, ur: 30 mg/dL — AB
Urobilinogen, UA: 0.2 mg/dL (ref 0.0–1.0)
pH: 5.5 (ref 5.0–8.0)

## 2012-09-12 LAB — CBC
HCT: 34.4 % — ABNORMAL LOW (ref 36.0–46.0)
MCHC: 32 g/dL (ref 30.0–36.0)
MCV: 83.9 fL (ref 78.0–100.0)
RDW: 14.1 % (ref 11.5–15.5)
WBC: 11.2 10*3/uL — ABNORMAL HIGH (ref 4.0–10.5)

## 2012-09-12 LAB — COMPREHENSIVE METABOLIC PANEL
Albumin: 2.7 g/dL — ABNORMAL LOW (ref 3.5–5.2)
BUN: 12 mg/dL (ref 6–23)
Chloride: 100 mEq/L (ref 96–112)
Creatinine, Ser: 0.6 mg/dL (ref 0.50–1.10)
GFR calc Af Amer: >90 mL/min (ref >90)
Total Bilirubin: 0.3 mg/dL (ref 0.3–1.2)

## 2012-09-12 MED ORDER — LIDOCAINE HCL (PF) 1 % IJ SOLN
INTRAMUSCULAR | Status: DC | PRN
  Administered 2012-09-12 (×2): 5 mL

## 2012-09-12 MED ORDER — FENTANYL 2.5 MCG/ML BUPIVACAINE 1/10 % EPIDURAL INFUSION (WH - ANES)
14.0000 mL/h | INTRAMUSCULAR | Status: DC | PRN
  Administered 2012-09-12 – 2012-09-13 (×2): 14 mL/h via EPIDURAL
  Filled 2012-09-12 (×2): qty 125

## 2012-09-12 MED ORDER — ACETAMINOPHEN 325 MG PO TABS
650.0000 mg | ORAL_TABLET | ORAL | Status: DC | PRN
  Administered 2012-09-12 – 2012-09-13 (×2): 650 mg via ORAL
  Filled 2012-09-12 (×2): qty 2

## 2012-09-12 MED ORDER — EPHEDRINE 5 MG/ML INJ
10.0000 mg | INTRAVENOUS | Status: DC | PRN
  Filled 2012-09-12: qty 2

## 2012-09-12 MED ORDER — PHENYLEPHRINE 40 MCG/ML (10ML) SYRINGE FOR IV PUSH (FOR BLOOD PRESSURE SUPPORT)
80.0000 ug | PREFILLED_SYRINGE | INTRAVENOUS | Status: DC | PRN
  Filled 2012-09-12: qty 2

## 2012-09-12 MED ORDER — AMLODIPINE BESYLATE 5 MG PO TABS
5.0000 mg | ORAL_TABLET | Freq: Every day | ORAL | Status: DC
  Administered 2012-09-12: 5 mg via ORAL
  Filled 2012-09-12: qty 1

## 2012-09-12 MED ORDER — OXYTOCIN 40 UNITS IN LACTATED RINGERS INFUSION - SIMPLE MED
1.0000 m[IU]/min | INTRAVENOUS | Status: DC
  Administered 2012-09-12: 1 m[IU]/min via INTRAVENOUS

## 2012-09-12 MED ORDER — PHENYLEPHRINE 40 MCG/ML (10ML) SYRINGE FOR IV PUSH (FOR BLOOD PRESSURE SUPPORT)
80.0000 ug | PREFILLED_SYRINGE | INTRAVENOUS | Status: DC | PRN
  Filled 2012-09-12: qty 5
  Filled 2012-09-12: qty 2

## 2012-09-12 MED ORDER — OXYTOCIN BOLUS FROM INFUSION
500.0000 mL | INTRAVENOUS | Status: DC
  Administered 2012-09-13: 500 mL via INTRAVENOUS

## 2012-09-12 MED ORDER — TERBUTALINE SULFATE 1 MG/ML IJ SOLN: 0.2500 mg | Freq: Once | INTRAMUSCULAR | Status: AC | PRN

## 2012-09-12 MED ORDER — OXYCODONE-ACETAMINOPHEN 5-325 MG PO TABS: 1.0000 | ORAL_TABLET | ORAL | Status: DC | PRN

## 2012-09-12 MED ORDER — OXYTOCIN 40 UNITS IN LACTATED RINGERS INFUSION - SIMPLE MED
62.5000 mL/h | INTRAVENOUS | Status: DC
  Filled 2012-09-12: qty 1000

## 2012-09-12 MED ORDER — DIPHENHYDRAMINE HCL 50 MG/ML IJ SOLN: 12.5000 mg | INTRAMUSCULAR | Status: DC | PRN

## 2012-09-12 MED ORDER — LACTATED RINGERS IV SOLN
INTRAVENOUS | Status: DC
  Administered 2012-09-12: 14:00:00 via INTRAVENOUS

## 2012-09-12 MED ORDER — LIDOCAINE HCL (PF) 1 % IJ SOLN
30.0000 mL | INTRAMUSCULAR | Status: DC | PRN
  Filled 2012-09-12 (×2): qty 30

## 2012-09-12 MED ORDER — ONDANSETRON HCL 4 MG/2ML IJ SOLN: 4.0000 mg | Freq: Four times a day (QID) | INTRAMUSCULAR | Status: DC | PRN

## 2012-09-12 MED ORDER — LACTATED RINGERS IV SOLN
500.0000 mL | INTRAVENOUS | Status: DC | PRN
  Administered 2012-09-13: 300 mL via INTRAVENOUS

## 2012-09-12 MED ORDER — LACTATED RINGERS IV SOLN
500.0000 mL | Freq: Once | INTRAVENOUS | Status: AC
  Administered 2012-09-12: 500 mL via INTRAVENOUS

## 2012-09-12 MED ORDER — FLEET ENEMA 7-19 GM/118ML RE ENEM: 1.0000 | ENEMA | RECTAL | Status: DC | PRN

## 2012-09-12 MED ORDER — CITRIC ACID-SODIUM CITRATE 334-500 MG/5ML PO SOLN: 30.0000 mL | ORAL | Status: DC | PRN

## 2012-09-12 MED ORDER — IBUPROFEN 600 MG PO TABS
600.0000 mg | ORAL_TABLET | Freq: Four times a day (QID) | ORAL | Status: DC | PRN
  Administered 2012-09-13: 600 mg via ORAL
  Filled 2012-09-12: qty 1

## 2012-09-12 MED ORDER — EPHEDRINE 5 MG/ML INJ
10.0000 mg | INTRAVENOUS | Status: DC | PRN
  Filled 2012-09-12: qty 2
  Filled 2012-09-12: qty 4

## 2012-09-12 NOTE — Progress Notes (Signed)
Pulse- 79 Patient reports lower abdominal pain/pressure and contractions; patient states baby isn't as active in the morning and fetal movement feels less but baby moves a lot in the evening.  Pt has H/A's every day - getting worse.

## 2012-09-12 NOTE — Anesthesia Preprocedure Evaluation (Signed)
Anesthesia Evaluation  Patient identified by MRN, date of birth, ID band Patient awake    Reviewed: Allergy & Precautions, H&P , Patient's Chart, lab work & pertinent test results  Airway Mallampati: II TM Distance: >3 FB Neck ROM: full    Dental no notable dental hx.    Pulmonary neg pulmonary ROS,  breath sounds clear to auscultation  Pulmonary exam normal       Cardiovascular hypertension, negative cardio ROS  Rhythm:regular Rate:Normal     Neuro/Psych negative neurological ROS  negative psych ROS   GI/Hepatic negative GI ROS, Neg liver ROS,   Endo/Other  negative endocrine ROS  Renal/GU negative Renal ROS     Musculoskeletal   Abdominal   Peds  Hematology negative hematology ROS (+)   Anesthesia Other Findings   Reproductive/Obstetrics (+) Pregnancy                           Anesthesia Physical Anesthesia Plan  ASA: III  Anesthesia Plan: Epidural   Post-op Pain Management:    Induction:   Airway Management Planned:   Additional Equipment:   Intra-op Plan:   Post-operative Plan:   Informed Consent: I have reviewed the patients History and Physical, chart, labs and discussed the procedure including the risks, benefits and alternatives for the proposed anesthesia with the patient or authorized representative who has indicated his/her understanding and acceptance.     Plan Discussed with:   Anesthesia Plan Comments:         Anesthesia Quick Evaluation  

## 2012-09-12 NOTE — Progress Notes (Signed)
Kara Conner is a 32 y.o. G3P2002 at [redacted]w[redacted]d by ultrasound admitted for induction of labor due to gestational HTN.  Subjective: Comfortable with epidural.  Objective: BP 126/73  Pulse 80  Temp(Src) 97.8 F (36.6 C) (Oral)  Resp 16  Ht 5\' 5"  (1.651 m)  Wt 80.287 kg (177 lb)  BMI 29.45 kg/m2  SpO2 99%  LMP 10/03/2011   Total I/O In: -  Out: 300 [Urine:300]  FHT:  FHR: 140 bpm, variability: moderate,  accelerations:  Present,  decelerations:  Absent UC:   regular, every 1-4 minutes SVE:   Dilation: 5 Effacement (%): 80 Station: Ballotable Exam by:: [employer] CNM  Labs: Lab Results  Component Value Date   WBC 11.2* 09/12/2012   HGB 11.0* 09/12/2012   HCT 34.4* 09/12/2012   MCV 83.9 09/12/2012   PLT 191 09/12/2012    Assessment / Plan: Induction of labor due to gestational hypertension,  progressing well on pitocin [place] used to confirm presentation (Baby is Vertex)  Labor: Progressing on Pitocin, will continue to increase then AROM Fetal Wellbeing:  Category I Pain Control:  Epidural Anticipated MOD:  NSVD  [name] Other 09/12/2012, 10:07 PM

## 2012-09-12 NOTE — Progress Notes (Signed)
NST reviewed and reactive. Patient reports frequent headaches relieved by tylenol (but taking more tylenol to relive headaches now). She denies visual disturbances, RUQ/epigastric pain. Patient took Norvasc last night at usual time. Will send to MAU for further evaluation. 5/12 EFW 2421 (59%tile)

## 2012-09-12 NOTE — H&P (Signed)
Kara Conner is a 32 y.o. female G3P2 at 37 weeks and 6 days presents for induction of labor secondary to gestational hypertension.  Patient reports worsening headaches over the past two weeks.  She reports minimal swelling.  She denies any blurry vision.  Patient's first pregnancy significant for hypertension (? Pre-eclampsia) requiring induction of labor.  History OB History   Grav Para Term Preterm Abortions TAB SAB Ect Mult Living   3 2 2       2      Past Medical History  Diagnosis Date   Hypertension     1st pregnancy for preeclampsia   History reviewed. No pertinent past surgical history. Family History: family history is not on file. Social History:  reports that she has never smoked. She does not have any smokeless tobacco history on file. She reports that she does not drink alcohol or use illicit drugs.  Review of Systems  Constitutional: Negative.   Eyes: Negative for blurred vision.  Respiratory: Negative.   Cardiovascular: Negative.   Gastrointestinal: Negative.   Genitourinary: Negative.   Neurological: Positive for headaches.      Blood pressure 134/109, pulse 93, temperature 98.2 F (36.8 C), temperature source Oral, resp. rate 18, height 5\' 5"  (1.651 m), weight 80.559 kg (177 lb 9.6 oz), last menstrual period 10/03/2011, SpO2 99.00%. Maternal Exam:  Uterine Assessment: Contraction strength is mild.  Contraction frequency is irregular.   Abdomen: Patient reports no abdominal tenderness. Fetal presentation: vertex     Physical Exam  Constitutional: She appears well-developed and well-nourished.  HENT:  Head: Normocephalic.  Eyes: Pupils are equal, round, and reactive to light.  Cardiovascular: Normal rate, regular rhythm and normal heart sounds.   Respiratory: Effort normal and breath sounds normal.  GI: Soft.  Genitourinary:  Cervix - 3 cm, 50%, -3  Skin: Skin is warm and dry.    Prenatal labs: ABO, Rh: --/--/A POS (03/19 1855) Antibody: NEG (03/19  1853) Rubella: 3.99 (03/19 1853) RPR: NON REACTIVE (03/19 1853)  HBsAg: NEGATIVE (03/19 1853)  HIV:   NONREACTIVE GBS:   NEGATIVE  Assessment/Plan: 32 y.o. female G3P2 at 37 weeks and 6 days presents for induction of labor secondary to gestational hypertension.  GBS negative.    -Admit to [facility]. -Start induction of labor with pitocin. -Obtain pre-eclampsia labs (CBC, CMP, urine-protein creatinine ratio).     [facility] 09/12/2012, 12:35 PM

## 2012-09-12 NOTE — Anesthesia Procedure Notes (Signed)
Epidural Patient location during procedure: OB Start time: 09/12/2012 5:35 PM  Staffing Anesthesiologist: Angus Seller., Harrell Gave. Performed by: anesthesiologist   Preanesthetic Checklist Completed: patient identified, site marked, surgical consent, pre-op evaluation, timeout performed, IV checked, risks and benefits discussed and monitors and equipment checked  Epidural Patient position: sitting Prep: site prepped and draped and DuraPrep Patient monitoring: continuous pulse ox and blood pressure Approach: midline Injection technique: LOR air and LOR saline  Needle:  Needle type: Tuohy  Needle gauge: 17 G Needle length: 9 cm and 9 Needle insertion depth: 5 cm cm Catheter type: closed end flexible Catheter size: 19 Gauge Catheter at skin depth: 10 cm Test dose: negative  Assessment Events: blood not aspirated, injection not painful, no injection resistance, negative IV test and no paresthesia  Additional Notes Patient identified.  Risk benefits discussed including failed block, incomplete pain control, headache, nerve damage, paralysis, blood pressure changes, nausea, vomiting, reactions to medication both toxic or allergic, and postpartum back pain.  Patient expressed understanding and wished to proceed.  All questions were answered.  Sterile technique used throughout procedure and epidural site dressed with sterile barrier dressing. No paresthesia or other complications noted.The patient did not experience any signs of intravascular injection such as tinnitus or metallic taste in mouth nor signs of intrathecal spread such as rapid motor block. Please see nursing notes for vital signs.

## 2012-09-12 NOTE — MAU Note (Signed)
[facility] for evaluation of elevated blood pressure.  

## 2012-09-12 NOTE — Progress Notes (Signed)
Subjective: Patient doing well.  Pain controlled.  Objective: BP 135/84   Pulse 90   Temp(Src) 98.1 F (36.7 C) (Oral)   Resp 18   Ht 5\' 5"  (1.651 m)   Wt 80.559 kg (177 lb 9.6 oz)   BMI 29.55 kg/m2   SpO2 99%   LMP 10/03/2011      FHT:  FHR: 140 bpm, variability: moderate,  accelerations:  Present,  decelerations:  Absent UC:   regular, every 3-4 minutes SVE:   Dilation: 3 Effacement (%): 50 Station: -2 Exam by:: Resident  Labs: Lab Results  Component Value Date   WBC 11.2* 09/12/2012   HGB 11.0* 09/12/2012   HCT 34.4* 09/12/2012   MCV 83.9 09/12/2012   PLT 191 09/12/2012    Assessment / Plan: Augmentation of labor, progressing well  Labor: Progressing normally Preeclampsia:  labs stable Fetal Wellbeing:  Category I Pain Control:  Labor support without medications I/D:  GBS negative Anticipated MOD:  NSVD  Holly Stegall 09/12/2012, 4:51 PM

## 2012-09-13 ENCOUNTER — Encounter (HOSPITAL_COMMUNITY): Payer: Self-pay | Admitting: Family Medicine

## 2012-09-13 DIAGNOSIS — O139 Gestational [pregnancy-induced] hypertension without significant proteinuria, unspecified trimester: Secondary | ICD-10-CM

## 2012-09-13 LAB — CBC
HCT: 30.9 % — ABNORMAL LOW (ref 36.0–46.0)
MCHC: 33 g/dL (ref 30.0–36.0)
MCV: 83.1 fL (ref 78.0–100.0)
Platelets: 191 10*3/uL (ref 150–400)
RDW: 14 % (ref 11.5–15.5)
WBC: 16.8 10*3/uL — ABNORMAL HIGH (ref 4.0–10.5)

## 2012-09-13 MED ORDER — ONDANSETRON HCL 4 MG PO TABS
4.0000 mg | ORAL_TABLET | ORAL | Status: DC | PRN
  Administered 2012-09-13 – 2012-09-14 (×2): 4 mg via ORAL
  Filled 2012-09-13 (×2): qty 1

## 2012-09-13 MED ORDER — IBUPROFEN 600 MG PO TABS
600.0000 mg | ORAL_TABLET | Freq: Four times a day (QID) | ORAL | Status: DC
  Administered 2012-09-13 – 2012-09-14 (×4): 600 mg via ORAL
  Filled 2012-09-13 (×5): qty 1

## 2012-09-13 MED ORDER — OXYCODONE-ACETAMINOPHEN 5-325 MG PO TABS
1.0000 | ORAL_TABLET | ORAL | Status: DC | PRN
  Administered 2012-09-13 – 2012-09-14 (×3): 1 via ORAL
  Filled 2012-09-13 (×3): qty 1

## 2012-09-13 MED ORDER — SENNOSIDES-DOCUSATE SODIUM 8.6-50 MG PO TABS
2.0000 | ORAL_TABLET | Freq: Every day | ORAL | Status: DC
  Administered 2012-09-13: 2 via ORAL

## 2012-09-13 MED ORDER — BENZOCAINE-MENTHOL 20-0.5 % EX AERO
1.0000 "application " | INHALATION_SPRAY | CUTANEOUS | Status: DC | PRN
  Filled 2012-09-13: qty 56

## 2012-09-13 MED ORDER — ZOLPIDEM TARTRATE 5 MG PO TABS: 5.0000 mg | ORAL_TABLET | Freq: Every evening | ORAL | Status: DC | PRN

## 2012-09-13 MED ORDER — WITCH HAZEL-GLYCERIN EX PADS: 1.0000 "application " | MEDICATED_PAD | CUTANEOUS | Status: DC | PRN

## 2012-09-13 MED ORDER — LANOLIN HYDROUS EX OINT: TOPICAL_OINTMENT | CUTANEOUS | Status: DC | PRN

## 2012-09-13 MED ORDER — SIMETHICONE 80 MG PO CHEW: 80.0000 mg | CHEWABLE_TABLET | ORAL | Status: DC | PRN

## 2012-09-13 MED ORDER — TETANUS-DIPHTH-ACELL PERTUSSIS 5-2.5-18.5 LF-MCG/0.5 IM SUSP: 0.5000 mL | Freq: Once | INTRAMUSCULAR | Status: DC

## 2012-09-13 MED ORDER — PRENATAL MULTIVITAMIN CH
1.0000 | ORAL_TABLET | Freq: Every day | ORAL | Status: DC
  Administered 2012-09-13: 1 via ORAL
  Filled 2012-09-13: qty 1

## 2012-09-13 MED ORDER — DIPHENHYDRAMINE HCL 25 MG PO CAPS: 25.0000 mg | ORAL_CAPSULE | Freq: Four times a day (QID) | ORAL | Status: DC | PRN

## 2012-09-13 MED ORDER — DIBUCAINE 1 % RE OINT: 1.0000 "application " | TOPICAL_OINTMENT | RECTAL | Status: DC | PRN

## 2012-09-13 MED ORDER — ONDANSETRON HCL 4 MG/2ML IJ SOLN: 4.0000 mg | INTRAMUSCULAR | Status: DC | PRN

## 2012-09-13 NOTE — Clinical Social Work Maternal (Signed)
    Clinical Social Work Department PSYCHOSOCIAL ASSESSMENT - MATERNAL/CHILD 09/13/2012  Patient:  Kara Conner, Kara Conner  Account Number:  [id number]  Admit Date:  09/12/2012  Marjo Bicker Name:   Audie Clear    Clinical Social Worker:  Nobie Putnam, LCSW   Date/Time:  09/13/2012 12:48 PM  Date Referred:  09/13/2012   Referral source  CN     Referred reason  [facility]  Depression/Anxiety   Other referral source:    I:  FAMILY / HOME ENVIRONMENT Child's legal guardian:  PARENT  Guardian - Name Guardian - Age Guardian - Address  Kara Conner [address] [place], [place] [address]  Kara Conner  (same as above)   Other household support members/support persons Name Relationship DOB  CJ SON 60 years old  Evonnie Pat 44 years old   Other support:   FOB's parents    II  PSYCHOSOCIAL DATA Information Source:  Patient Interview  Event organiser Employment:   Surveyor, quantity resources:  [employer] If Medicaid - County:  [employer] Other  Chemical engineer / Grade:   Maternity Care Coordinator / Child Services Coordination / Early Interventions:  Cultural issues impacting care:    III  STRENGTHS Strengths  Adequate Resources  Home prepared for Child (including basic supplies)  Supportive family/friends   Strength comment:    IV  RISK FACTORS AND CURRENT PROBLEMS Current Problem:  YES   Risk Factor & Current Problem Patient Issue Family Issue Risk Factor / Current Problem Comment  Other - See comment Y N LPNC  Mental Illness Y N Hx of depression/anxiety    V  SOCIAL WORK ASSESSMENT CSW met with pt to assess reason for [facility] @ 28 weeks & depression/anxiety symptoms.  Pt learned about pregnancy in January & applied for right away.  Pt told CSW that she had to wait to received Medicaid benefits before she could establish [facility].  Once she established [facility] she attended her appointments regularly.  She denies any illegal substance use & verbalized understanding of hospital  drug testing policy.  UDS & meconium results are pending.  Pt depression/anxiety symptoms are being managed well with Effexor.  She has all the necessary supplies for the infant & appears to be bonding well.  She identified her spouse & his parents as her primary support system.  CSW will monitor drug screen results & make a referral if necessary.      VI SOCIAL WORK PLAN Social Work Plan  No Further Intervention Required / No Barriers to Discharge   Type of pt/family education:   If child protective services report - county:   If child protective services report - date:   Information/referral to community resources comment:   Other social work plan:

## 2012-09-13 NOTE — Anesthesia Postprocedure Evaluation (Signed)
Anesthesia Post Note  Patient: Kara Conner  Procedure(s) Performed: * No procedures listed *  Anesthesia type: Epidural  Patient location: Mother/Baby  Post pain: Pain level controlled  Post assessment: Post-op Vital signs reviewed  Last Vitals:  Filed Vitals:   09/13/12 0650  BP: 132/89  Pulse: 80  Temp: 36.6 C  Resp: 18    Post vital signs: Reviewed  Level of consciousness:alert  Complications: No apparent anesthesia complications

## 2012-09-13 NOTE — Progress Notes (Signed)
UR completed 

## 2012-09-13 NOTE — Progress Notes (Signed)
   Kara Conner is a 32 y.o. G3P2002 at [redacted]w[redacted]d  admitted for induction of labor due to Hypertension.  Subjective: Comfortable with epidural Objective: BP 134/84  Pulse 75  Temp(Src) 98 F (36.7 C) (Oral)  Resp 18  Ht 5\' 5"  (1.651 m)  Wt 80.287 kg (177 lb)  BMI 29.45 kg/m2  SpO2 99%  LMP 10/03/2011 Total I/O In: -  Out: 1250 [Urine:1250]  FHT:  FHR: 150 bpm, variability: moderate,  accelerations:  Present,  decelerations:  Absent UC:   regular, every 3 minutes SVE:   Dilation: 6.5 Effacement (%): 90 Station: -2 Exam by:: F. Cresenzo CNM Pitocin @ 18 mu/min  Labs: Lab Results  Component Value Date   WBC 11.2* 09/12/2012   HGB 11.0* 09/12/2012   HCT 34.4* 09/12/2012   MCV 83.9 09/12/2012   PLT 191 09/12/2012    Assessment / Plan: Induction of labor due to gestational hypertension,  progressing well on pitocin AROM wit clear fluid Labor: Progressing normally Fetal Wellbeing:  Category I Pain Control:  Epidural Anticipated MOD:  NSVD  CRESENZO-DISHMAN,Kvion Shapley 09/13/2012, 2:01 AM

## 2012-09-13 NOTE — Lactation Note (Signed)
This note was copied from the chart of Kara Conner. Lactation Consultation Note Initial visit with mom. She reports that baby nursed well after delivery but has been sleepy since. Baby asleep in mom's arms. Mom reports that with her first baby BF hurt so much that she quit after 2 Joab Carden. Reports that when the baby nursed after delivery it did not hurt. Reviewed wide open mouth and having the baby deep into the breast. BF brochure given with resources for support after DC. No questions at present. To page for assist prn  Patient Name: Kara Conner [id number] Date: 09/13/2012 Reason for consult: Initial assessment   Maternal Data Formula Feeding for Exclusion: No Infant to breast within first hour of birth: Yes Does the patient have breastfeeding experience prior to this delivery?: Yes  Feeding   LATCH Score/Interventions                      Lactation Tools Discussed/Used     Consult Status Consult Status: Follow-up Date: 09/14/12 Follow-up type: In-patient    Pamelia Hoit 09/13/2012, 11:46 AM

## 2012-09-14 MED ORDER — VENLAFAXINE HCL ER 150 MG PO CP24: 150.0000 mg | ORAL_CAPSULE | Freq: Every day | ORAL | Status: DC

## 2012-09-14 MED ORDER — IBUPROFEN 600 MG PO TABS: 600.0000 mg | ORAL_TABLET | Freq: Four times a day (QID) | ORAL | Status: DC

## 2012-09-14 NOTE — Discharge Summary (Signed)
Obstetric Discharge Summary Reason for Admission: induction of labor due to [facility] Prenatal Procedures: ultrasound Intrapartum Procedures: spontaneous vaginal delivery Postpartum Procedures: none Complications-Operative and Postpartum: 2nd degree perineal laceration Hemoglobin  Date Value Range Status  09/13/2012 10.2* 12.0 - 15.0 g/dL Final     HCT  Date Value Range Status  09/13/2012 30.9* 36.0 - 46.0 % Final    Physical Exam:  General: alert, cooperative and no distress, but does seem to have flat affect Lochia: appropriate Uterine Fundus: firm DVT Evaluation: No evidence of DVT seen on physical exam. Negative Homan's sign. No significant calf/ankle edema.  Discharge Diagnoses: Term Pregnancy-delivered  Hospital Course: Kara Conner is a 32 y.o. G3P3003 at [redacted]w[redacted]d who was admitted to the hospital after presenting for IOL due to Gestational HTN. Patient had uncomplicated SVD.  Pt is breast feeding and plans to use Mirena for contraception. [facility] 1weeks for bp check then 6wks for pp visit.   Discharge Information: Date: 09/14/2012 Activity: pelvic rest Diet: routine Medications: PNV and Ibuprofen, effexor 150mg  daily Condition: stable Instructions: refer to practice specific booklet Discharge to: home   Newborn Data: Live born female  Birth Weight: 6 lb 9.8 oz (2999 g) APGAR: 9, 9  Home with mother.  [name] Other 09/14/2012, 7:51 AM   I spoke with and examined patient and agree with resident's note and plan of care.  Eating, drinking, voiding, ambulating well.  +flatus.  Lochia and pain wnl.  Denies dizziness, lightheadedness, or sob. No complaints.  Needs to be seen by SW prior to d/c for h/o depression, on effexor.  Pt to continue taking effexor after d/c.  Cheral Marker, CNM, [facility] 09/14/2012 9:29 AM

## 2012-09-25 ENCOUNTER — Encounter: Payer: Self-pay | Admitting: *Deleted

## 2012-10-18 ENCOUNTER — Ambulatory Visit: Payer: Medicaid Other | Admitting: Advanced Practice Midwife

## 2014-02-09 ENCOUNTER — Encounter (HOSPITAL_COMMUNITY): Payer: Self-pay | Admitting: Family Medicine

## 2015-03-03 IMAGING — US US OB COMP +14 WK
1 series · 12 of 28 positions shown · non-contrast
Comparison: none

[Series 1: us ob comp +14 wk · 74 acquisitions, 12 frames shown]
[im 3/74]
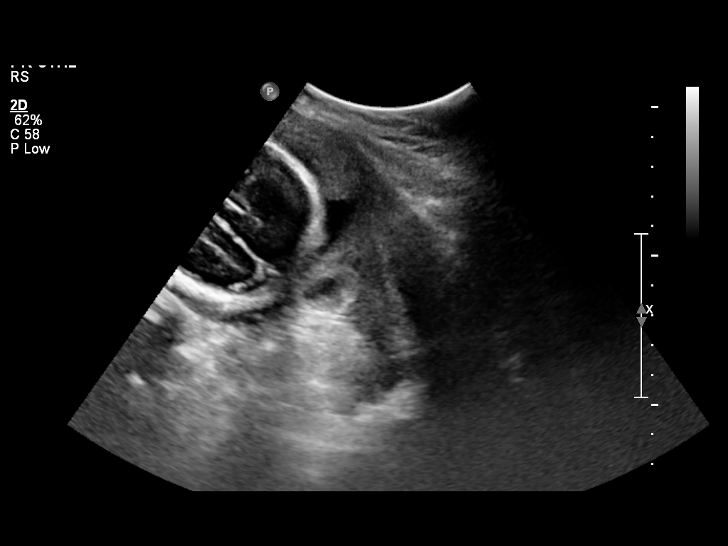
[im 9/74]
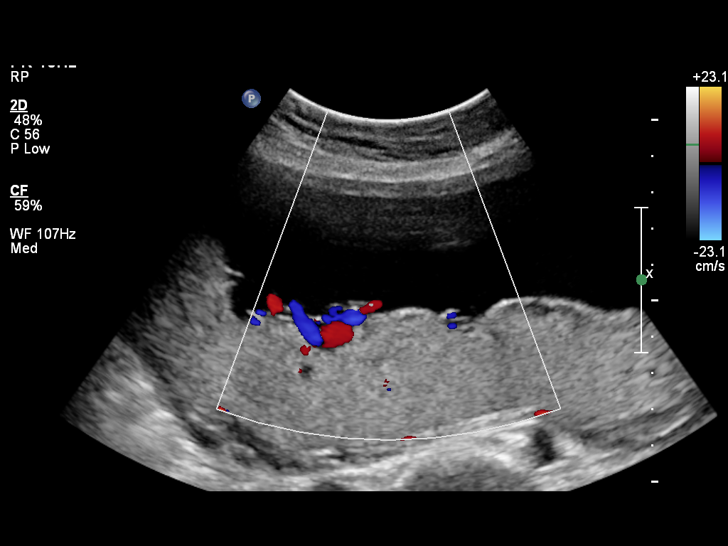
[im 14/74]
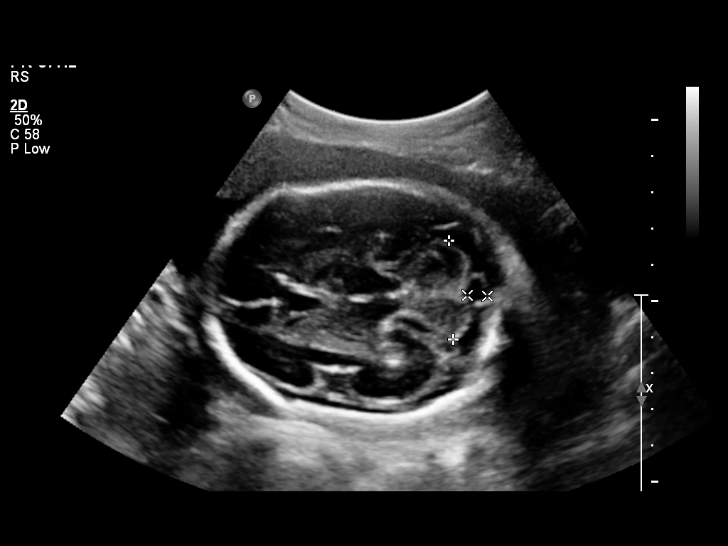
[im 22/74]
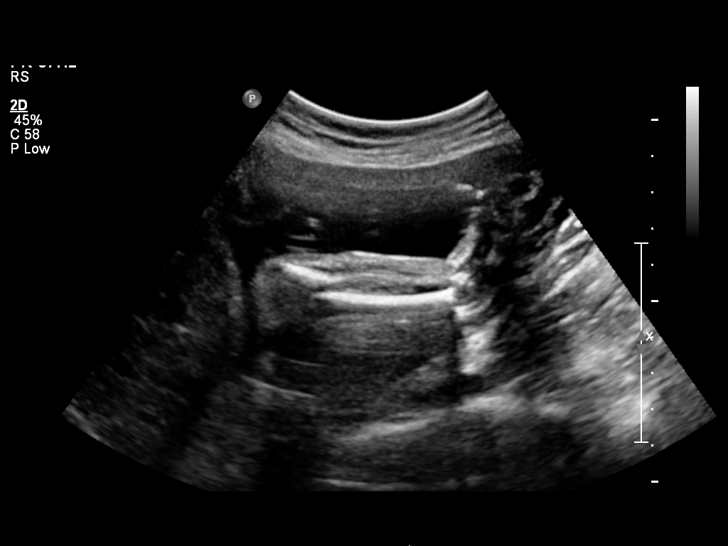
[im 28/74]
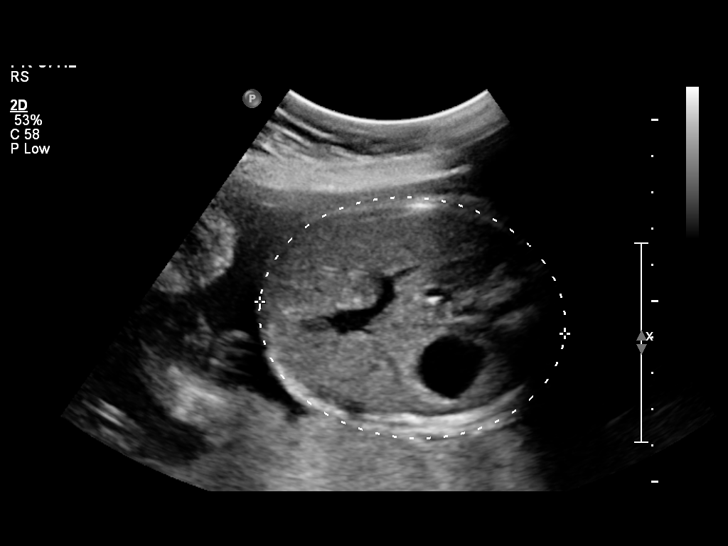
[im 33/74]
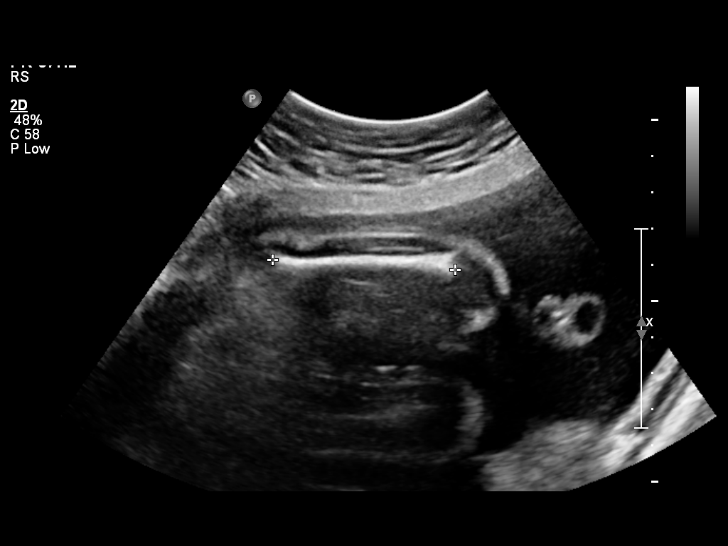
[im 41/74]
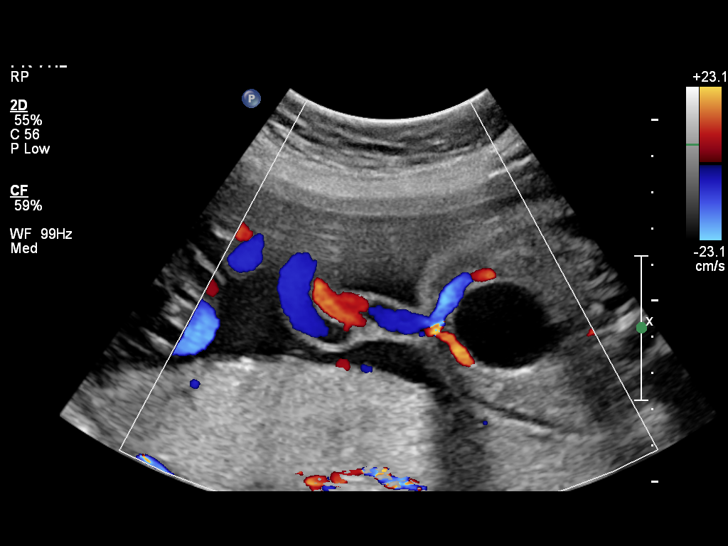
[im 46/74]
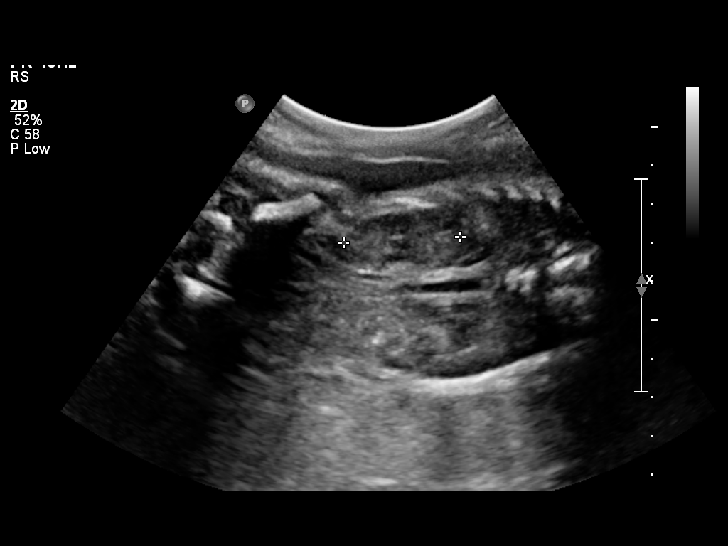
[im 52/74]
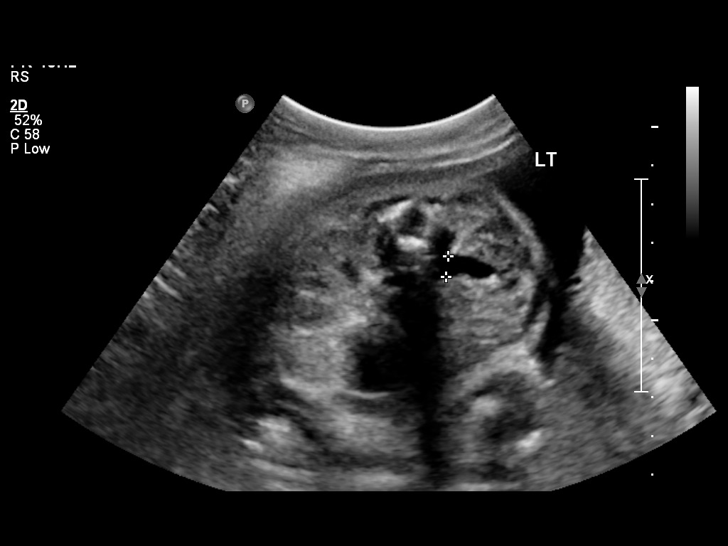
[im 60/74]
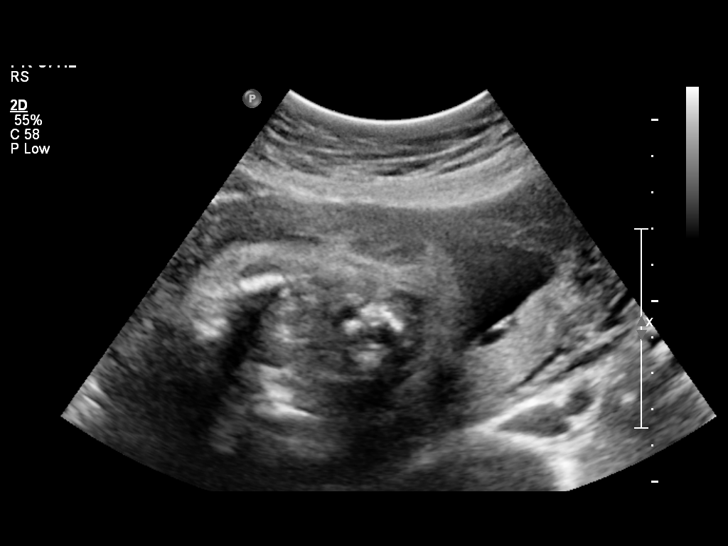
[im 65/74]
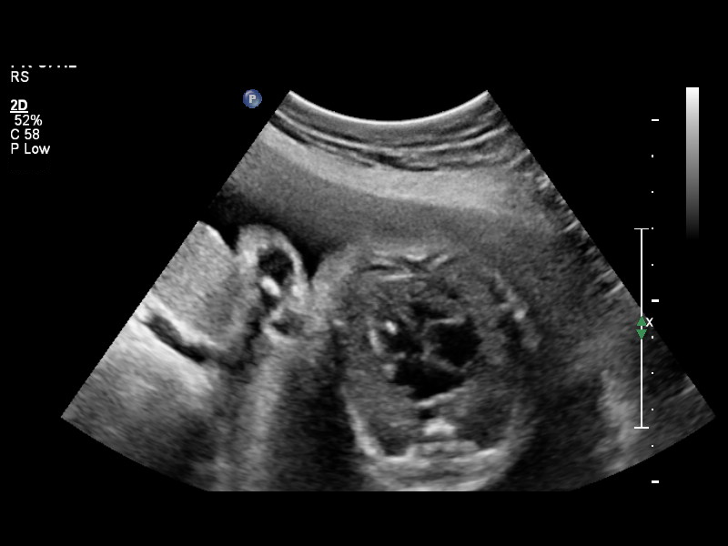
[im 71/74]
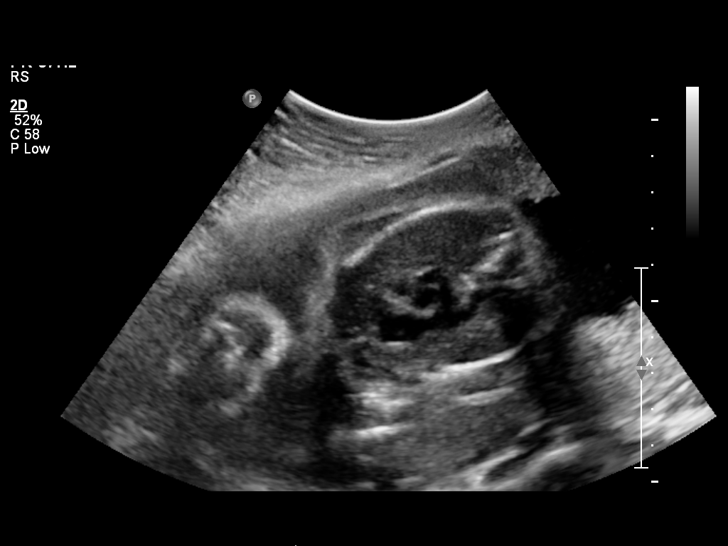

[12 of 28 positions shown; findings below may reference images not displayed]

OBSTETRICS REPORT
                      (Signed Final 07/02/2012 [DATE])

Service(s) Provided

 US OB COMP + 14 WK                                    76805.1
Indications

 Basic anatomic survey
 Unsure of LMP;  Establish Gestational [AGE]
 No or Little Prenatal Care
Fetal Evaluation

 Num Of Fetuses:    1
 Fetal Heart Rate:  142                         bpm
 Cardiac Activity:  Observed
 Presentation:      Cephalic
 Placenta:          Posterior, above cervical
                    os
 P. Cord            Visualized, central
 Insertion:

 Amniotic Fluid
 AFI FV:      Subjectively within normal limits
                                             Larg Pckt:       5  cm
Biometry

 BPD:     67.7  mm    G. Age:   27w 2d                CI:        71.62   70 - 86
                                                      FL/HC:      19.9   18.8 -

 HC:     254.7  mm    G. Age:   27w 5d       24  %    HC/AC:      1.06   1.05 -

 AC:     240.4  mm    G. Age:   28w 2d       65  %    FL/BPD:     74.9   71 - 87
 FL:      50.7  mm    G. Age:   27w 1d       24  %    FL/AC:      21.1   20 - 24
 HUM:     46.4  mm    G. Age:   27w 2d       42  %

 Est. FW:    6613  gm      2 lb 8 oz     57  %
Gestational Age

 U/S Today:     27w 4d                                        EDD:   09/27/12
 Best:          27w 4d    Det. By:   U/S (07/02/12)           EDD:   09/27/12
Anatomy

 Cranium:          Appears normal         Ductal Arch:      Appears normal
 Fetal Cavum:      Appears normal         Diaphragm:        Appears normal
 Ventricles:       Appears normal         Stomach:          Appears normal, left
                                                            sided
 Cerebellum:       Appears normal         Abdomen:          Appears normal
 Posterior Fossa:  Appears normal         Abdominal Wall:   Appears nml (cord
                                                            insert, abd wall)
 Nuchal Fold:      Not applicable (>20    Cord Vessels:     Appears normal (3
                   wks GA)                                  vessel cord)
 Face:             Appears normal         Kidneys:          Appear normal
                   (orbits and profile)
 Lips:             Appears normal         Bladder:          Appears normal
 Heart:            Echogenic focus        Spine:            Appears normal
                   in LV
 RVOT:             Appears normal         Lower             Appears normal
                                          Extremities:
 LVOT:             Appears normal         Upper             Appears normal
                                          Extremities:
 Aortic Arch:      Appears normal

 Other:  Male gender. 5th digit visualized. Nasal bone visualized. Technically
         difficult due to advanced gestational age.
Cervix Uterus Adnexa

 Cervical Length:   4.1       cm

 Cervix:       Normal appearance by transabdominal scan.
 Uterus:       No abnormality visualized.
 Left Ovary:   No adnexal mass visualized.
 Right Ovary:  No adnexal mass visualized.
Impression

 Single living IUP with estimated GA by ultrasound of 27w 4d,
 and EDD of 09/27/2012.
 No fetal anatomic abnormality is identified.
 Echogenic intracardiac focus noted, which is a soft marker for
 Trisomy 21. No other markers for Trisomy 21 are seen on
 today's ultrasound.
 Normal amniotic fluid volume and cervical length.

 questions or concerns.

## 2015-04-11 NOTE — L&D Delivery Note (Signed)
  Delivery Note   Pt progressed nicely throough labor.  SROM w/ clear fluid around midnight.  After a 1  Ctx 2nd stage, At 12:27 AM a viable female was delivered via Vaginal, Spontaneous Delivery (Presentation: ;  ).  APGAR: 9, 9; weight  .  Pending  After 2 minutes, the cord was clamped and cut. 40 units of pitocin diluted in 1000cc LR was infused rapidly IV.  The placenta separated spontaneously and delivered via CCT and maternal pushing effort.  It was inspected and appears to be intact with a 3 VC. There was a loose nuchal cord (reduced) and a  True knot Anesthesia:   Episiotomy: None Lacerations: 1st degree Suture Repair: 2.0 vicryl Est. Blood Loss (mL): 250  Mom to postpartum.  Baby to Couplet care / Skin to Skin.  CRESENZO-DISHMAN,Olvin Rohr 01/21/2016, 12:45 AM       A

## 2015-09-15 ENCOUNTER — Telehealth: Payer: Self-pay

## 2015-09-15 NOTE — Telephone Encounter (Signed)
Called patient to make appt for prenatal care - referral from [facility]

## 2015-09-29 ENCOUNTER — Ambulatory Visit (INDEPENDENT_AMBULATORY_CARE_PROVIDER_SITE_OTHER): Payer: Medicaid Other | Admitting: Obstetrics

## 2015-09-29 ENCOUNTER — Encounter: Payer: Self-pay | Admitting: Obstetrics

## 2015-09-29 VITALS — BP 117/78 | HR 85 | Wt 163.0 lb

## 2015-09-29 DIAGNOSIS — O09522 Supervision of elderly multigravida, second trimester: Secondary | ICD-10-CM

## 2015-09-29 DIAGNOSIS — Z3689 Encounter for other specified antenatal screening: Secondary | ICD-10-CM

## 2015-09-29 DIAGNOSIS — G44009 Cluster headache syndrome, unspecified, not intractable: Secondary | ICD-10-CM

## 2015-09-29 DIAGNOSIS — Z3482 Encounter for supervision of other normal pregnancy, second trimester: Secondary | ICD-10-CM

## 2015-09-29 LAB — POCT URINALYSIS DIPSTICK
BILIRUBIN UA: NEGATIVE
Blood, UA: NEGATIVE
Glucose, UA: NEGATIVE
KETONES UA: NEGATIVE
Nitrite, UA: NEGATIVE
PH UA: 6
Spec Grav, UA: 1.015
Urobilinogen, UA: NEGATIVE

## 2015-09-29 MED ORDER — BUTALBITAL-APAP-CAFFEINE 50-325-40 MG PO TABS: 2.0000 | ORAL_TABLET | Freq: Four times a day (QID) | ORAL | Status: DC | PRN

## 2015-09-29 MED ORDER — PRENATE MINI 29-0.6-0.4-350 MG PO CAPS: 1.0000 | ORAL_CAPSULE | Freq: Every day | ORAL | Status: DC

## 2015-09-30 ENCOUNTER — Encounter: Payer: Self-pay | Admitting: Obstetrics

## 2015-09-30 LAB — PRENATAL PROFILE I(LABCORP)
Antibody Screen: NEGATIVE
BASOS: 0 %
Basophils Absolute: 0 10*3/uL (ref 0.0–0.2)
EOS (ABSOLUTE): 0.1 10*3/uL (ref 0.0–0.4)
Eos: 1 %
HEMOGLOBIN: 11.4 g/dL (ref 11.1–15.9)
Hematocrit: 35 % (ref 34.0–46.6)
Hepatitis B Surface Ag: NEGATIVE
IMMATURE GRANS (ABS): 0 10*3/uL (ref 0.0–0.1)
IMMATURE GRANULOCYTES: 0 %
LYMPHS: 19 %
Lymphocytes Absolute: 1.8 10*3/uL (ref 0.7–3.1)
MCH: 28.4 pg (ref 26.6–33.0)
MCHC: 32.6 g/dL (ref 31.5–35.7)
MCV: 87 fL (ref 79–97)
MONOS ABS: 0.6 10*3/uL (ref 0.1–0.9)
Monocytes: 6 %
NEUTROS PCT: 74 %
Neutrophils Absolute: 6.9 10*3/uL (ref 1.4–7.0)
Platelets: 255 10*3/uL (ref 150–379)
RBC: 4.01 x10E6/uL (ref 3.77–5.28)
RDW: 13.9 % (ref 12.3–15.4)
RPR: NONREACTIVE
Rh Factor: POSITIVE
Rubella Antibodies, IGG: 3.08 index (ref >0.99)
WBC: 9.3 10*3/uL (ref 3.4–10.8)

## 2015-09-30 LAB — HEMOGLOBINOPATHY EVALUATION
HEMOGLOBIN A2 QUANTITATION: 2.6 % (ref 0.7–3.1)
HGB A: 97.4 % (ref 94.0–98.0)
HGB C: 0 %
HGB S: 0 %
Hemoglobin F Quantitation: 0 % (ref 0.0–2.0)

## 2015-09-30 LAB — VARICELLA ZOSTER ANTIBODY, IGG: Varicella zoster IgG: 425 index (ref >165)

## 2015-09-30 LAB — VITAMIN D 25 HYDROXY (VIT D DEFICIENCY, FRACTURES): Vit D, 25-Hydroxy: 23.4 ng/mL — ABNORMAL LOW (ref 30.0–100.0)

## 2015-09-30 LAB — HIV ANTIBODY (ROUTINE TESTING W REFLEX): HIV Screen 4th Generation wRfx: NONREACTIVE

## 2015-09-30 NOTE — Progress Notes (Signed)
Subjective:    Kara Conner is being seen today for her first obstetrical visit.  This is not a planned pregnancy. She is at 4234w0d gestation. Her obstetrical history is significant for none. Relationship with FOB: spouse, living together. Patient does intend to breast feed. Pregnancy history fully reviewed.  The information documented in the HPI was reviewed and verified.  Menstrual History: OB History    Gravida Para Term Preterm AB TAB SAB Ectopic Multiple Living   4 3 3       3        Patient's last menstrual period was 05/27/2015.    Past Medical History  Diagnosis Date  . Hypertension     1st pregnancy for preeclampsia    History reviewed. No pertinent past surgical history.   (Not in a hospital admission) No Known Allergies  Social History  Substance Use Topics  . Smoking status: Never Smoker   . Smokeless tobacco: Not on file  . Alcohol Use: No    History reviewed. No pertinent family history.   Review of Systems Constitutional: negative for weight loss Gastrointestinal: negative for vomiting Genitourinary:negative for genital lesions and vaginal discharge and dysuria Musculoskeletal:negative for back pain Behavioral/Psych: negative for abusive relationship, depression, illegal drug usage and tobacco use    Objective:    BP 117/78 mmHg  Pulse 85  Wt 163 lb (73.936 kg)  LMP 05/27/2015 General Appearance:    Alert, cooperative, no distress, appears stated age  Head:    Normocephalic, without obvious abnormality, atraumatic  Eyes:    PERRL, conjunctiva/corneas clear, EOM's intact, fundi    benign, both eyes  Ears:    Normal TM's and external ear canals, both ears  Nose:   Nares normal, septum midline, mucosa normal, no drainage    or sinus tenderness  Throat:   Lips, mucosa, and tongue normal; teeth and gums normal  Neck:   Supple, symmetrical, trachea midline, no adenopathy;    thyroid:  no enlargement/tenderness/nodules; no carotid   bruit or JVD  Back:      Symmetric, no curvature, ROM normal, no CVA tenderness  Lungs:     Clear to auscultation bilaterally, respirations unlabored  Chest Wall:    No tenderness or deformity   Heart:    Regular rate and rhythm, S1 and S2 normal, no murmur, rub   or gallop  Breast Exam:    No tenderness, masses, or nipple abnormality  Abdomen:     Soft, non-tender, bowel sounds active all four quadrants,    no masses, no organomegaly  Genitalia:    Normal female without lesion, discharge or tenderness  Extremities:   Extremities normal, atraumatic, no cyanosis or edema  Pulses:   2+ and symmetric all extremities  Skin:   Skin color, texture, turgor normal, no rashes or lesions  Lymph nodes:   Cervical, supraclavicular, and axillary nodes normal  Neurologic:   CNII-XII intact, normal strength, sensation and reflexes    throughout      Lab Review Urine pregnancy test Labs reviewed yes Radiologic studies reviewed no Assessment:    Pregnancy at 3834w0d weeks    AMA   Plan:    Referred to MFM.  Prenatal vitamins.  Counseling provided regarding continued use of seat belts, cessation of alcohol consumption, smoking or use of illicit drugs; infection precautions i.e., influenza/TDAP immunizations, toxoplasmosis,CMV, parvovirus, listeria and varicella; workplace safety, exercise during pregnancy; routine dental care, safe medications, sexual activity, hot tubs, saunas, pools, travel, caffeine use, fish and methlymercury,  potential toxins, hair treatments, varicose veins Weight gain recommendations per IOM guidelines reviewed: underweight/BMI< 18.5--> gain 28 - 40 lbs; normal weight/BMI 18.5 - 24.9--> gain 25 - 35 lbs; overweight/BMI 25 - 29.9--> gain 15 - 25 lbs; obese/BMI >30->gain  11 - 20 lbs Problem list reviewed and updated. FIRST/CF mutation testing/NIPT/QUAD SCREEN/fragile X/Ashkenazi Jewish population testing/Spinal muscular atrophy discussed: requested. Role of ultrasound in pregnancy discussed; fetal  survey: requested. Amniocentesis discussed:  VBAC calculator score: VBAC consent form provided  Meds ordered this encounter  Medications  . aspirin-acetaminophen-caffeine (EXCEDRIN MIGRAINE) 250-250-65 MG tablet    Sig: Take by mouth every 6 (six) hours as needed for headache. Reported on 09/29/2015  . butalbital-acetaminophen-caffeine (FIORICET) 50-325-40 MG tablet    Sig: Take 2 tablets by mouth every 6 (six) hours as needed for headache.    Dispense:  40 tablet    Refill:  2  . Prenat w/o A-FeCbn-Meth-FA-DHA (PRENATE MINI) 29-0.6-0.4-350 MG CAPS    Sig: Take 1 capsule by mouth daily before lunch.    Dispense:  90 capsule    Refill:  3   Orders Placed This Encounter  Procedures  . Culture, OB Urine  . US MFM OB DETAIL +14 WK    Standing Status: Future     Number of Occurrences:      Standing Expiration Date: 11/28/2016    Order Specific Question:  Reason for Exam (SYMPTOM  OR DIAGNOSIS REQUIRED)    Answer:  AMA    Order Specific Question:  Preferred imaging location?    Answer:  [facility]-Ultrasound  . US MFM OB Transvaginal    Standing Status: Future     Number of Occurrences:      Standing Expiration Date: 11/28/2016    Order Specific Question:  Reason for Exam (SYMPTOM  OR DIAGNOSIS REQUIRED)    Answer:  AMA    Order Specific Question:  Preferred imaging location?    Answer:  [facility]-Ultrasound  . HIV antibody  . Hemoglobinopathy evaluation  . Varicella zoster antibody, IgG  . VITAMIN D 25 Hydroxy (Vit-D Deficiency, Fractures)  . Prenatal Profile I  . AMB referral to maternal fetal medicine    Referral Priority:  Routine    Referral Type:  Consultation    Referral Reason:  Specialty Services Required    Number of Visits Requested:  1  . AMB MFM GENETICS REFERRAL    Referral Priority:  Routine    Referral Type:  Consultation    Referral Reason:  Specialty Services Required    Number of Visits Requested:  1  . POCT urinalysis dipstick    Follow up in 2 weeks.

## 2015-10-01 LAB — URINE CULTURE, OB REFLEX

## 2015-10-01 LAB — CULTURE, OB URINE

## 2015-10-02 ENCOUNTER — Other Ambulatory Visit: Payer: Self-pay | Admitting: Obstetrics

## 2015-10-02 DIAGNOSIS — B373 Candidiasis of vulva and vagina: Secondary | ICD-10-CM

## 2015-10-02 DIAGNOSIS — B3731 Acute candidiasis of vulva and vagina: Secondary | ICD-10-CM

## 2015-10-02 LAB — NUSWAB VG+, CANDIDA 6SP
CHLAMYDIA TRACHOMATIS, NAA: NEGATIVE
Candida albicans, NAA: POSITIVE — AB
Candida glabrata, NAA: NEGATIVE
Candida krusei, NAA: NEGATIVE
Candida lusitaniae, NAA: NEGATIVE
Candida parapsilosis, NAA: NEGATIVE
Candida tropicalis, NAA: NEGATIVE
NEISSERIA GONORRHOEAE, NAA: NEGATIVE
TRICH VAG BY NAA: NEGATIVE

## 2015-10-02 LAB — PAP IG AND HPV HIGH-RISK
HPV, high-risk: POSITIVE — AB
PAP Smear Comment: 0

## 2015-10-02 MED ORDER — TERCONAZOLE 0.4 % VA CREA: 1.0000 | TOPICAL_CREAM | Freq: Every day | VAGINAL | Status: DC

## 2015-10-07 ENCOUNTER — Telehealth: Payer: Self-pay | Admitting: *Deleted

## 2015-10-07 NOTE — Telephone Encounter (Signed)
Spoke with

## 2015-10-08 ENCOUNTER — Encounter: Payer: Medicaid Other | Admitting: Obstetrics

## 2015-10-11 ENCOUNTER — Encounter (HOSPITAL_COMMUNITY): Payer: Self-pay | Admitting: Obstetrics

## 2015-10-20 ENCOUNTER — Ambulatory Visit (HOSPITAL_COMMUNITY)
Admission: RE | Admit: 2015-10-20 | Discharge: 2015-10-20 | Disposition: A | Discharge status: Home / Self Care | Payer: Medicaid Other | Source: Ambulatory Visit | Attending: Obstetrics | Admitting: Obstetrics

## 2015-10-20 ENCOUNTER — Other Ambulatory Visit: Payer: Self-pay | Admitting: Obstetrics

## 2015-10-20 ENCOUNTER — Encounter (HOSPITAL_COMMUNITY): Payer: Self-pay

## 2015-10-20 DIAGNOSIS — O09522 Supervision of elderly multigravida, second trimester: Secondary | ICD-10-CM | POA: Insufficient documentation

## 2015-10-20 DIAGNOSIS — O09292 Supervision of pregnancy with other poor reproductive or obstetric history, second trimester: Secondary | ICD-10-CM | POA: Diagnosis not present

## 2015-10-20 DIAGNOSIS — Z3A25 25 weeks gestation of pregnancy: Secondary | ICD-10-CM | POA: Insufficient documentation

## 2015-10-20 DIAGNOSIS — Z3689 Encounter for other specified antenatal screening: Secondary | ICD-10-CM

## 2015-10-20 DIAGNOSIS — Z3482 Encounter for supervision of other normal pregnancy, second trimester: Secondary | ICD-10-CM

## 2015-10-20 DIAGNOSIS — Z36 Encounter for antenatal screening of mother: Secondary | ICD-10-CM | POA: Diagnosis not present

## 2015-10-20 NOTE — Addendum Note (Signed)
Encounter addended by: Mady Gemmaaragh Karver Fadden on: 10/20/2015  5:26 PM<BR>     Documentation filed: Charges VN

## 2015-10-20 NOTE — Progress Notes (Signed)
Appointment Date: 10/20/2015 DOB: 11-06-1980 Referring Provider: Brock Conner, Kara A, MD Attending: Dr. Alpha GulaPaul Conner  Kara Conner, Kara Conner, were seen for genetic counseling because of Conner maternal age of 35 y.o..     In summary:  Discussed AMA and associated risk for fetal aneuploidy  Discussed options for screening  NIPS - drawn today  Ultrasound - performed today  Discussed diagnostic testing options  Amniocentesis - declined  Reviewed family history concerns - none discussed  Discussed carrier screening options - declined  CF  SMA  Hemoglobinopathies  They were counseled regarding maternal age and the association with risk for chromosome conditions due to nondisjunction with aging of the ova.   We reviewed chromosomes, nondisjunction, and the associated 1 in 1488 risk for fetal aneuploidy related to Conner maternal age of 35 y.o. at 9652w6d gestation.  They were counseled that the risk for aneuploidy decreases as gestational age increases, accounting for those pregnancies which spontaneously abort.  We specifically discussed Down syndrome (trisomy 4621), trisomies 9513 and 8518, and sex chromosome aneuploidies (47,XXX and 47,XXY) including the common features and prognoses of each.   We reviewed available screening options including noninvasive prenatal screening (NIPS)/cell free DNA (cfDNA) screening, and detailed ultrasound.  They were counseled that screening tests are used to modify Conner patient's Conner priori risk for aneuploidy, typically based on age. This estimate provides Conner pregnancy specific risk assessment. We reviewed the benefits and limitations of each option. Specifically, we discussed the conditions for which each test screens, the detection rates, and false positive rates of each. They were also counseled regarding diagnostic testing via [facility] and amniocentesis. We reviewed the approximate 1 in 300-500 risk for complications from amniocentesis, including spontaneous  pregnancy loss. We discussed the possible results that the tests might provide including: positive, negative, unanticipated, and no result. Finally, they were counseled regarding the cost of each option and potential out of pocket expenses.   After consideration of all the options, they elected to proceed with NIPS.  Those results will be available in 8-10 days.    Conner complete ultrasound was performed today. The ultrasound report will be sent under separate cover. There were no visualized fetal anomalies although an echogenic intracardiac focus was identified. Diagnostic testing was declined today.  They understand that screening tests, including ultrasound and NIPS, cannot rule out all birth defects or genetic syndromes.   Kara Conner was provided with written information regarding cystic fibrosis (CF), spinal muscular atrophy (SMA) and hemoglobinopathies including the carrier frequency, availability of carrier screening and prenatal diagnosis if indicated.  In addition, we discussed that CF and hemoglobinopathies are routinely screened for as part of the [place] newborn screening panel.  After further discussion, she declined screening for CF, SMA and hemoglobinopathies.  Both family histories were reviewed and found to be noncontributory for birth defects, intellectual disability, and known genetic conditions. Without further information regarding the provided family history, an accurate genetic risk cannot be calculated. Further genetic counseling is warranted if more information is obtained.  Kara Conner denied exposure to environmental toxins or chemical agents. She denied the use of alcohol, tobacco or street drugs. She denied significant viral illnesses during the course of her pregnancy. Her medical and surgical histories were noncontributory.   I counseled this couple regarding the above risks and available options.  The approximate face-to-face time with the genetic counselor was 54 minutes. Most of  the counseling was provided by Kara Conner,  under my direct supervision.   Kara Gemma, MS,  Certified Genetic Counselor

## 2015-10-21 ENCOUNTER — Other Ambulatory Visit (HOSPITAL_COMMUNITY): Payer: Self-pay | Admitting: *Deleted

## 2015-10-21 DIAGNOSIS — O09529 Supervision of elderly multigravida, unspecified trimester: Secondary | ICD-10-CM

## 2015-10-27 ENCOUNTER — Encounter: Payer: Self-pay | Admitting: Obstetrics

## 2015-10-27 ENCOUNTER — Ambulatory Visit (INDEPENDENT_AMBULATORY_CARE_PROVIDER_SITE_OTHER): Payer: Medicaid Other | Admitting: Obstetrics

## 2015-10-27 VITALS — BP 122/81 | HR 72 | Temp 98.1°F | Wt 170.0 lb

## 2015-10-27 DIAGNOSIS — Z3483 Encounter for supervision of other normal pregnancy, third trimester: Secondary | ICD-10-CM

## 2015-10-27 LAB — POCT URINALYSIS DIPSTICK
Bilirubin, UA: NEGATIVE
Glucose, UA: NEGATIVE
KETONES UA: NEGATIVE
Leukocytes, UA: NEGATIVE
Nitrite, UA: NEGATIVE
PH UA: 5
RBC UA: NEGATIVE
SPEC GRAV UA: 1.015
UROBILINOGEN UA: NEGATIVE

## 2015-10-27 NOTE — Progress Notes (Signed)
Subjective:    Kara Conner is a 35 y.o. female being seen today for her obstetrical visit. She is at 180w6d gestation. Patient reports: no complaints . Fetal movement: normal.  Problem List Items Addressed This Visit    None    Visit Diagnoses    Encounter for supervision of other normal pregnancy in second trimester    -  Primary    Relevant Orders    POCT urinalysis dipstick (Completed)      Patient Active Problem List   Diagnosis Date Noted  . Supervision of high-risk pregnancy 07/11/2012  . Late prenatal care complicating pregnancy 07/11/2012  . Transient hypertension of pregnancy, antepartum 07/11/2012   Objective:    BP 122/81 mmHg  Pulse 72  Temp(Src) 98.1 F (36.7 C)  Wt 170 lb (77.111 kg)  LMP 05/27/2015 (Approximate) FHT: 150 BPM  Uterine Size: size equals dates     Assessment:    Pregnancy @ 3280w6d    Plan:    OBGCT: ordered for next visit. Signs and symptoms of preterm labor: discussed.  Labs, problem list reviewed and updated 2 hr GTT planned Follow up in 2 weeks.

## 2015-10-28 ENCOUNTER — Other Ambulatory Visit (HOSPITAL_COMMUNITY): Payer: Self-pay

## 2015-10-28 ENCOUNTER — Telehealth (HOSPITAL_COMMUNITY): Payer: Self-pay | Admitting: MS"

## 2015-10-28 NOTE — Telephone Encounter (Signed)
Attempted to call patient regarding prenatal cell free DNA testing, which was within normal limits. Left message for patient to return call.   Clydie BraunKaren Sonam Wandel 10/28/2015 9:17 AM

## 2015-10-28 NOTE — Telephone Encounter (Signed)
Called Silvestre Gunneroni Deleon to discuss her prenatal cell free DNA test results.  Ms. Silvestre Gunneroni Wilbon had [facility] testing through [facility].  Testing was offered because of maternal age.   The patient was identified by name and DOB.  We reviewed that these are within normal limits, showing a less than 1 in 10,000 risk for trisomies 21, 18 and 13, and monosomy X (Turner syndrome).  In addition, the risk for triploidy and sex chromosome trisomies (47,XXX and 47,XXY) was also low risk.  We reviewed that this testing identifies > 99% of pregnancies with trisomy 7721, trisomy 6113, sex chromosome trisomies (47,XXX and 47,XXY), and triploidy. The detection rate for trisomy 18 is 96%.  The detection rate for monosomy X is ~92%.  The false positive rate is <0.1% for all conditions. Testing was also consistent with female fetal sex.   She understands that this testing does not identify all genetic conditions.  All questions were answered to her satisfaction, she was encouraged to call with additional questions or concerns.  Quinn PlowmanKaren Takumi Din, MS Patent attorneyCertified Genetic Counselor

## 2015-11-11 ENCOUNTER — Other Ambulatory Visit: Payer: Medicaid Other

## 2015-11-11 ENCOUNTER — Ambulatory Visit (INDEPENDENT_AMBULATORY_CARE_PROVIDER_SITE_OTHER): Payer: Medicaid Other | Admitting: Obstetrics

## 2015-11-11 VITALS — BP 125/88 | HR 85 | Temp 98.4°F | Wt 173.2 lb

## 2015-11-11 DIAGNOSIS — O0993 Supervision of high risk pregnancy, unspecified, third trimester: Secondary | ICD-10-CM

## 2015-11-11 DIAGNOSIS — Z3492 Encounter for supervision of normal pregnancy, unspecified, second trimester: Secondary | ICD-10-CM

## 2015-11-11 LAB — POCT URINALYSIS DIPSTICK
Bilirubin, UA: NEGATIVE
Blood, UA: NEGATIVE
GLUCOSE UA: NEGATIVE
Ketones, UA: NEGATIVE
Nitrite, UA: NEGATIVE
Spec Grav, UA: 1.02
Urobilinogen, UA: 0.2
pH, UA: 6

## 2015-11-11 NOTE — Progress Notes (Signed)
Patient is having a lot of pain and pressure whenever she is up and active. She gets dizzy and feels faint when out. O2 Sat 97

## 2015-11-12 LAB — GLUCOSE TOLERANCE, 1 HOUR: GLUCOSE, 1HR PP: 120 mg/dL (ref 65–199)

## 2015-11-17 ENCOUNTER — Encounter: Payer: Self-pay | Admitting: Obstetrics

## 2015-11-17 ENCOUNTER — Ambulatory Visit (INDEPENDENT_AMBULATORY_CARE_PROVIDER_SITE_OTHER): Payer: Medicaid Other | Admitting: Obstetrics

## 2015-11-17 VITALS — BP 135/89 | HR 91 | Temp 98.3°F | Wt 172.6 lb

## 2015-11-17 DIAGNOSIS — Z3493 Encounter for supervision of normal pregnancy, unspecified, third trimester: Secondary | ICD-10-CM

## 2015-11-17 DIAGNOSIS — G44009 Cluster headache syndrome, unspecified, not intractable: Secondary | ICD-10-CM

## 2015-11-17 DIAGNOSIS — O133 Gestational [pregnancy-induced] hypertension without significant proteinuria, third trimester: Secondary | ICD-10-CM

## 2015-11-17 DIAGNOSIS — O09523 Supervision of elderly multigravida, third trimester: Secondary | ICD-10-CM

## 2015-11-17 LAB — POCT URINALYSIS DIPSTICK
Bilirubin, UA: NEGATIVE
Blood, UA: NEGATIVE
Glucose, UA: NEGATIVE
KETONES UA: NEGATIVE
LEUKOCYTES UA: NEGATIVE
Nitrite, UA: NEGATIVE
Spec Grav, UA: 1.015
UROBILINOGEN UA: NEGATIVE
pH, UA: 6

## 2015-11-17 MED ORDER — ASPIRIN EC 81 MG PO TBEC: 81.0000 mg | DELAYED_RELEASE_TABLET | Freq: Every day | ORAL | 5 refills | Status: DC

## 2015-11-17 MED ORDER — NIFEDIPINE ER OSMOTIC RELEASE 30 MG PO TB24: 30.0000 mg | ORAL_TABLET | Freq: Every day | ORAL | 5 refills | Status: DC

## 2015-11-17 NOTE — Progress Notes (Signed)
Patient ID: Kara Conner, female   DOB: [birth year], 35 y.o.   MRN: [id number] Subjective:    Kara Conner is a 35 y.o. female being seen today for her obstetrical visit. She is at 5336w6d gestation. Patient reports no complaints. Fetal movement: normal.  Problem List Items Addressed This Visit    None    Visit Diagnoses    Prenatal care, third trimester    -  Primary   Relevant Orders   POCT urinalysis dipstick     Patient Active Problem List   Diagnosis Date Noted  . Supervision of high-risk pregnancy 07/11/2012  . Late prenatal care complicating pregnancy 07/11/2012  . Transient hypertension of pregnancy, antepartum 07/11/2012   Objective:    BP 135/89   Pulse 91   Temp 98.3 F (36.8 C)   Wt 172 lb 9.6 oz (78.3 kg)   LMP 05/27/2015 (Approximate)   BMI 28.72 kg/m  FHT:  150 BPM  Uterine Size: size equals dates  Presentation: cephalic     Assessment:    Pregnancy @ 3736w6d weeks    PIH.  Patient had preeclampsia with all 3 previous pregnancies and was induced at  37-39 weeks for worsening BP's  Plan:   24 hour urine submitted today Procardia XL 30 mg po daily for BP control EC ASA 81 mg po daily for preeclampsia thrombogenesis prophylaxis   labs reviewed, problem list updated Consent signed. GBS sent TDAP offered  Rhogam given for RH negative Pediatrician: discussed. Infant feeding: plans to breastfeed. Maternity leave: discussed.  Orders Placed This Encounter  Procedures  . POCT urinalysis dipstick   No orders of the defined types were placed in this encounter.  Follow up in 1 Week.

## 2015-11-17 NOTE — Addendum Note (Signed)
Addended by: Marya LandryFOSTER, Kaelan Amble D on: 11/17/2015 05:04 PM   Modules accepted: Orders

## 2015-11-17 NOTE — Progress Notes (Signed)
Patient ID: Kara Conner, female   DOB: Feb 05, 1981, 35 y.o.   MRN: [id number] Subjective:    Kara Conner is a 35 y.o. female being seen today for her obstetrical visit. She is at 588w6d gestation. Patient reports: no complaints . Fetal movement: normal.  Problem List Items Addressed This Visit    Supervision of high-risk pregnancy - Primary   Relevant Orders   POCT Urinalysis Dipstick (Completed)   Glucose tolerance, 1 hour (Completed)    Other Visit Diagnoses   None.    Patient Active Problem List   Diagnosis Date Noted  . Supervision of high-risk pregnancy 07/11/2012  . Late prenatal care complicating pregnancy 07/11/2012  . Transient hypertension of pregnancy, antepartum 07/11/2012   Objective:    BP 125/88   Pulse 85   Temp 98.4 F (36.9 C)   Wt 173 lb 3.2 oz (78.6 kg)   LMP 05/27/2015 (Approximate)   BMI 28.82 kg/m  FHT: 150 BPM  Uterine Size: size equals dates     Assessment:    Pregnancy @ 238w6d    Plan:    Signs and symptoms of preterm labor: discussed.  Labs, problem list reviewed and updated 2 hr GTT planned Follow up in 4 weeks.

## 2015-11-17 NOTE — Progress Notes (Signed)
Patient is concerned about her rising blood pressure.

## 2015-11-17 NOTE — Addendum Note (Signed)
Addended by: Burnell BlanksMASHBURN, Aarushi Hemric on: 11/17/2015 05:07 PM   Modules accepted: Orders

## 2015-11-18 LAB — CBC
Hematocrit: 34.5 % (ref 34.0–46.6)
Hemoglobin: 11.3 g/dL (ref 11.1–15.9)
MCH: 28.5 pg (ref 26.6–33.0)
MCHC: 32.8 g/dL (ref 31.5–35.7)
MCV: 87 fL (ref 79–97)
Platelets: 245 10*3/uL (ref 150–379)
RBC: 3.97 x10E6/uL (ref 3.77–5.28)
RDW: 14.4 % (ref 12.3–15.4)
WBC: 8.4 10*3/uL (ref 3.4–10.8)

## 2015-11-18 LAB — HIV ANTIBODY (ROUTINE TESTING W REFLEX): HIV Screen 4th Generation wRfx: NONREACTIVE

## 2015-11-18 LAB — CREATININE CLEARANCE, URINE, 24 HOUR
CREATININE 24H UR: 1525 mg/(24.h) (ref 800–1800)
CREATININE, UR: 92.4 mg/dL
CREATININE: 0.63 mg/dL (ref 0.57–1.00)
Creatinine Clearance: 168 mL/min — ABNORMAL HIGH (ref 88–128)
GFR, EST AFRICAN AMERICAN: 133 mL/min/{1.73_m2} (ref >59)
GFR, EST NON AFRICAN AMERICAN: 116 mL/min/{1.73_m2} (ref >59)

## 2015-11-18 LAB — PROTEIN, URINE, 24 HOUR
Protein, 24H Urine: 172 mg/24 hr — ABNORMAL HIGH (ref 30–150)
Protein, Ur: 10.4 mg/dL

## 2015-11-18 LAB — RPR: RPR Ser Ql: NONREACTIVE

## 2015-11-25 ENCOUNTER — Ambulatory Visit (INDEPENDENT_AMBULATORY_CARE_PROVIDER_SITE_OTHER): Payer: Medicaid Other | Admitting: Obstetrics

## 2015-11-25 ENCOUNTER — Encounter: Payer: Self-pay | Admitting: Obstetrics

## 2015-11-25 VITALS — BP 126/80 | HR 84 | Temp 98.7°F | Wt 174.0 lb

## 2015-11-25 DIAGNOSIS — Z3493 Encounter for supervision of normal pregnancy, unspecified, third trimester: Secondary | ICD-10-CM

## 2015-11-25 DIAGNOSIS — O133 Gestational [pregnancy-induced] hypertension without significant proteinuria, third trimester: Secondary | ICD-10-CM

## 2015-11-25 DIAGNOSIS — O09893 Supervision of other high risk pregnancies, third trimester: Secondary | ICD-10-CM

## 2015-11-25 LAB — POCT URINALYSIS DIPSTICK
Bilirubin, UA: NEGATIVE
Glucose, UA: NEGATIVE
KETONES UA: NEGATIVE
Nitrite, UA: NEGATIVE
PH UA: 5
PROTEIN UA: 1
SPEC GRAV UA: 1.02
Urobilinogen, UA: NEGATIVE

## 2015-11-25 NOTE — Progress Notes (Signed)
Patient c/o nerve pain in her back that sends a "shock' through her into legs

## 2015-11-25 NOTE — Progress Notes (Signed)
Patient ID: Kara Conner, female   DOB: [birth year], 35 y.o.   MRN: [id number]  Subjective:    Kara Conner is a 35 y.o. female being seen today for her obstetrical visit. She is at 6571w0d gestation. Patient reports backache. Fetal movement: normal.  Problem List Items Addressed This Visit    None    Visit Diagnoses    Prenatal care, third trimester    -  Primary   Relevant Orders   POCT urinalysis dipstick (Completed)     Patient Active Problem List   Diagnosis Date Noted  . Supervision of high-risk pregnancy 07/11/2012  . Late prenatal care complicating pregnancy 07/11/2012  . Transient hypertension of pregnancy, antepartum 07/11/2012   Objective:    BP 126/80   Pulse 84   Temp 98.7 F (37.1 C)   Wt 174 lb (78.9 kg)   LMP 05/27/2015 (Approximate)   BMI 28.96 kg/m  FHT:  150 BPM  Uterine Size: size equals dates  Presentation: unsure     Assessment:    Pregnancy @ 1371w0d weeks   Plan:     labs reviewed, problem list updated Consent signed. GBS sent TDAP offered  Rhogam given for RH negative Pediatrician: discussed. Infant feeding: plans to breastfeed. Maternity leave: discussed. Cigarette smoking: never smoked. Orders Placed This Encounter  Procedures  . POCT urinalysis dipstick   Meds ordered this encounter  Medications  . Prenat-FeCbn-FeAsp-Meth-FA-DHA (PRENATE MINI) 18-0.6-0.4-350 MG CAPS    Refill:  0   Follow up in 1 Week.

## 2015-12-01 ENCOUNTER — Ambulatory Visit (HOSPITAL_COMMUNITY): Payer: Medicaid Other

## 2015-12-02 ENCOUNTER — Other Ambulatory Visit (HOSPITAL_COMMUNITY): Payer: Self-pay | Admitting: Maternal and Fetal Medicine

## 2015-12-02 ENCOUNTER — Encounter (HOSPITAL_COMMUNITY): Payer: Self-pay

## 2015-12-02 ENCOUNTER — Ambulatory Visit (HOSPITAL_COMMUNITY)
Admission: RE | Admit: 2015-12-02 | Discharge: 2015-12-02 | Disposition: A | Discharge status: Home / Self Care | Payer: Medicaid Other | Source: Ambulatory Visit | Attending: Obstetrics | Admitting: Obstetrics

## 2015-12-02 DIAGNOSIS — O09293 Supervision of pregnancy with other poor reproductive or obstetric history, third trimester: Secondary | ICD-10-CM | POA: Insufficient documentation

## 2015-12-02 DIAGNOSIS — O09529 Supervision of elderly multigravida, unspecified trimester: Secondary | ICD-10-CM

## 2015-12-02 DIAGNOSIS — O09523 Supervision of elderly multigravida, third trimester: Secondary | ICD-10-CM | POA: Diagnosis present

## 2015-12-02 DIAGNOSIS — O365930 Maternal care for other known or suspected poor fetal growth, third trimester, not applicable or unspecified: Secondary | ICD-10-CM

## 2015-12-02 DIAGNOSIS — Z3A32 32 weeks gestation of pregnancy: Secondary | ICD-10-CM | POA: Diagnosis not present

## 2015-12-03 ENCOUNTER — Other Ambulatory Visit (HOSPITAL_COMMUNITY): Payer: Self-pay | Admitting: *Deleted

## 2015-12-03 DIAGNOSIS — IMO0002 Reserved for concepts with insufficient information to code with codable children: Secondary | ICD-10-CM

## 2015-12-09 ENCOUNTER — Encounter: Payer: Self-pay | Admitting: Obstetrics

## 2015-12-09 ENCOUNTER — Ambulatory Visit (INDEPENDENT_AMBULATORY_CARE_PROVIDER_SITE_OTHER): Payer: Medicaid Other | Admitting: Obstetrics

## 2015-12-09 ENCOUNTER — Encounter: Payer: Self-pay | Admitting: *Deleted

## 2015-12-09 VITALS — BP 122/86 | HR 93 | Temp 97.6°F | Wt 176.1 lb

## 2015-12-09 DIAGNOSIS — O133 Gestational [pregnancy-induced] hypertension without significant proteinuria, third trimester: Secondary | ICD-10-CM

## 2015-12-09 DIAGNOSIS — Z3493 Encounter for supervision of normal pregnancy, unspecified, third trimester: Secondary | ICD-10-CM

## 2015-12-09 DIAGNOSIS — G44009 Cluster headache syndrome, unspecified, not intractable: Secondary | ICD-10-CM

## 2015-12-09 DIAGNOSIS — O09523 Supervision of elderly multigravida, third trimester: Secondary | ICD-10-CM

## 2015-12-09 LAB — POCT URINALYSIS DIPSTICK
BILIRUBIN UA: NEGATIVE
Glucose, UA: NEGATIVE
KETONES UA: NEGATIVE
Nitrite, UA: NEGATIVE
PH UA: 7
RBC UA: NEGATIVE
SPEC GRAV UA: 1.015
Urobilinogen, UA: 0.2

## 2015-12-09 NOTE — Progress Notes (Signed)
Patient has no questions or concerns at this time 

## 2015-12-09 NOTE — Progress Notes (Signed)
Subjective:    Kara Conner is a 35 y.o. female being seen today for her obstetrical visit. She is at 5329w0d gestation. Patient reports no complaints. Fetal movement: normal.  Problem List Items Addressed This Visit    None    Visit Diagnoses    Prenatal care, third trimester    -  Primary   Relevant Orders   POCT Urinalysis Dipstick (Completed)     Patient Active Problem List   Diagnosis Date Noted  . Supervision of high-risk pregnancy 07/11/2012  . Late prenatal care complicating pregnancy 07/11/2012  . Transient hypertension of pregnancy, antepartum 07/11/2012   Objective:    BP 122/86   Pulse 93   Temp 97.6 F (36.4 C)   Wt 176 lb 1.6 oz (79.9 kg)   LMP 05/27/2015 (Approximate)   BMI 29.30 kg/m  FHT:  150 BPM  Uterine Size: size less than dates  Presentation: unsure     Assessment:    Pregnancy @ 8129w0d weeks   AMA.  PIH.  Stable on Procardia XL  Plan:     Continue Procardia   labs reviewed, problem list updated Consent signed. GBS sent TDAP offered  Rhogam given for RH negative Pediatrician: discussed. Infant feeding: plans to breastfeed. Maternity leave: discussed. Cigarette smoking: never smoked. Orders Placed This Encounter  Procedures  . POCT Urinalysis Dipstick   No orders of the defined types were placed in this encounter.  Follow up in 1 Week.

## 2015-12-10 ENCOUNTER — Encounter (HOSPITAL_COMMUNITY): Payer: Self-pay

## 2015-12-10 ENCOUNTER — Ambulatory Visit (HOSPITAL_COMMUNITY)
Admission: RE | Admit: 2015-12-10 | Discharge: 2015-12-10 | Disposition: A | Discharge status: Home / Self Care | Payer: Medicaid Other | Source: Ambulatory Visit | Attending: Obstetrics | Admitting: Obstetrics

## 2015-12-10 DIAGNOSIS — O09293 Supervision of pregnancy with other poor reproductive or obstetric history, third trimester: Secondary | ICD-10-CM | POA: Diagnosis not present

## 2015-12-10 DIAGNOSIS — O09523 Supervision of elderly multigravida, third trimester: Secondary | ICD-10-CM | POA: Insufficient documentation

## 2015-12-10 DIAGNOSIS — O365930 Maternal care for other known or suspected poor fetal growth, third trimester, not applicable or unspecified: Secondary | ICD-10-CM | POA: Diagnosis not present

## 2015-12-10 DIAGNOSIS — IMO0002 Reserved for concepts with insufficient information to code with codable children: Secondary | ICD-10-CM

## 2015-12-10 DIAGNOSIS — Z3A33 33 weeks gestation of pregnancy: Secondary | ICD-10-CM | POA: Diagnosis not present

## 2015-12-16 ENCOUNTER — Ambulatory Visit (INDEPENDENT_AMBULATORY_CARE_PROVIDER_SITE_OTHER): Payer: Medicaid Other | Admitting: Obstetrics

## 2015-12-16 ENCOUNTER — Encounter: Payer: Self-pay | Admitting: Obstetrics

## 2015-12-16 VITALS — BP 117/79 | HR 77 | Temp 97.0°F | Wt 178.5 lb

## 2015-12-16 DIAGNOSIS — O133 Gestational [pregnancy-induced] hypertension without significant proteinuria, third trimester: Secondary | ICD-10-CM

## 2015-12-16 DIAGNOSIS — O09523 Supervision of elderly multigravida, third trimester: Secondary | ICD-10-CM

## 2015-12-16 DIAGNOSIS — Z3493 Encounter for supervision of normal pregnancy, unspecified, third trimester: Secondary | ICD-10-CM

## 2015-12-16 NOTE — Progress Notes (Signed)
Subjective:    Kara Conner is a 35 y.o. female being seen today for her obstetrical visit. She is at 2177w0d gestation. Patient reports no complaints. Fetal movement: normal.  Problem List Items Addressed This Visit    None    Visit Diagnoses   None.    Patient Active Problem List   Diagnosis Date Noted  . Supervision of high-risk pregnancy 07/11/2012  . Late prenatal care complicating pregnancy 07/11/2012  . Transient hypertension of pregnancy, antepartum 07/11/2012   Objective:    BP 117/79   Pulse 77   Temp 97 F (36.1 C)   Wt 178 lb 8 oz (81 kg)   LMP 05/27/2015 (Approximate)   BMI 29.70 kg/m  FHT:  150 BPM  Uterine Size: size equals dates  Presentation: unsure     Assessment:    Pregnancy @ 2477w0d weeks   Plan:     labs reviewed, problem list updated Consent signed. GBS sent TDAP offered  Rhogam given for RH negative Pediatrician: discussed. Infant feeding: plans to breastfeed. Maternity leave: discussed. Cigarette smoking: never smoked. No orders of the defined types were placed in this encounter.  No orders of the defined types were placed in this encounter.  Follow up in 1 Week.

## 2015-12-17 ENCOUNTER — Ambulatory Visit (HOSPITAL_COMMUNITY)
Admission: RE | Admit: 2015-12-17 | Discharge: 2015-12-17 | Disposition: A | Discharge status: Home / Self Care | Payer: Medicaid Other | Source: Ambulatory Visit | Attending: Obstetrics | Admitting: Obstetrics

## 2015-12-17 ENCOUNTER — Encounter (HOSPITAL_COMMUNITY): Payer: Self-pay

## 2015-12-17 ENCOUNTER — Inpatient Hospital Stay (HOSPITAL_COMMUNITY)
Admission: AD | Admit: 2015-12-17 | Discharge: 2015-12-17 | Disposition: A | Discharge status: Home / Self Care | Payer: Medicaid Other | Source: Ambulatory Visit | Attending: Obstetrics and Gynecology | Admitting: Obstetrics and Gynecology

## 2015-12-17 DIAGNOSIS — Z3A34 34 weeks gestation of pregnancy: Secondary | ICD-10-CM | POA: Insufficient documentation

## 2015-12-17 DIAGNOSIS — O163 Unspecified maternal hypertension, third trimester: Secondary | ICD-10-CM | POA: Diagnosis not present

## 2015-12-17 DIAGNOSIS — O10913 Unspecified pre-existing hypertension complicating pregnancy, third trimester: Secondary | ICD-10-CM

## 2015-12-17 DIAGNOSIS — O09523 Supervision of elderly multigravida, third trimester: Secondary | ICD-10-CM | POA: Insufficient documentation

## 2015-12-17 DIAGNOSIS — O09293 Supervision of pregnancy with other poor reproductive or obstetric history, third trimester: Secondary | ICD-10-CM | POA: Diagnosis present

## 2015-12-17 DIAGNOSIS — Z7982 Long term (current) use of aspirin: Secondary | ICD-10-CM | POA: Diagnosis not present

## 2015-12-17 DIAGNOSIS — O365930 Maternal care for other known or suspected poor fetal growth, third trimester, not applicable or unspecified: Secondary | ICD-10-CM | POA: Insufficient documentation

## 2015-12-17 DIAGNOSIS — IMO0002 Reserved for concepts with insufficient information to code with codable children: Secondary | ICD-10-CM

## 2015-12-17 DIAGNOSIS — Z79899 Other long term (current) drug therapy: Secondary | ICD-10-CM | POA: Insufficient documentation

## 2015-12-17 NOTE — MAU Provider Note (Signed)
Chief Complaint:  bpp 6/8   First Provider Initiated Contact with Patient 12/17/15 1748      HPI: Kara Conner is a 35 y.o. [id number] with IUP at 6713w1d, and pregnancy complicated by chronic HTN who presents to maternity admissions from [facility] for NST d/t 6/8 on BPP. Patient doing well and denies any concerns.   Denies contractions, leakage of fluid or vaginal bleeding. Good fetal movement.  Denies headaches, blurry vision  Pregnancy Course: PNC at [facility] - Chronic HTN, well controlled on Procardia  Past Medical History: Past Medical History:  Diagnosis Date  . Hypertension    1st pregnancy for preeclampsia    Past obstetric history: OB History  Gravida Para Term Preterm AB Living  4 3 3     3   SAB TAB Ectopic Multiple Live Births          3    # Outcome Date GA Lbr Len/2nd Weight Sex Delivery Anes PTL Lv  4 Current           3 Term 09/13/12 9543w0d 08:04 / 00:12 6 lb 9.8 oz (2.999 kg) M Vag-Spont EPI  LIV  2 Term 02/02/06 4288w0d  7 lb 7 oz (3.374 kg) M Vag-Spont EPI N LIV     Birth Comments: No prob  1 Term 04/27/04 1111w0d  6 lb 5 oz (2.863 kg) M Vag-Spont EPI N LIV      Past Surgical History: No past surgical history on file.   Family History: No family history on file.  Social History: Social History  Substance Use Topics  . Smoking status: Never Smoker  . Smokeless tobacco: Never Used  . Alcohol use No    Allergies: No Known Allergies  Meds:  Prescriptions Prior to Admission  Medication Sig Dispense Refill Last Dose  . acetaminophen (TYLENOL) 500 MG tablet Take 1,000 mg by mouth every 6 (six) hours as needed for mild pain, moderate pain or headache.    Past Week at Unknown time  . aspirin EC 81 MG tablet Take 81 mg by mouth at bedtime.   12/16/2015 at 2100  . butalbital-acetaminophen-caffeine (FIORICET) 50-325-40 MG tablet Take 2 tablets by mouth every 6 (six) hours as needed for headache. 40 tablet 2 Past Week at Unknown time  . NIFEdipine  (PROCARDIA-XL/ADALAT-CC/NIFEDICAL-XL) 30 MG 24 hr tablet Take 30 mg by mouth at bedtime.   12/16/2015 at Unknown time  . ondansetron (ZOFRAN ODT) 8 MG disintegrating tablet Take 1 tablet (8 mg total) by mouth every 8 (eight) hours as needed for nausea. 30 tablet 2 12/17/2015 at Unknown time  . Prenat w/o A-FeCbn-Meth-FA-DHA (PRENATE MINI) 29-0.6-0.4-350 MG CAPS Take 1 capsule by mouth at bedtime.   12/16/2015 at Unknown time    I have reviewed patient's Past Medical Hx, Surgical Hx, Family Hx, Social Hx, medications and allergies.   ROS:  A comprehensive ROS was negative except per HPI.    Physical Exam   Patient Vitals for the past 24 hrs:  BP Temp Temp src Pulse Resp  12/17/15 1736 126/85 97.8 F (36.6 C) Oral 73 16   Constitutional: Well-developed, well-nourished female in no acute distress.  Cardiovascular: normal rate Respiratory: normal effort GI: Abd soft, non-tender, gravid appropriate for gestational age.  MS: Extremities nontender, no edema, normal ROM Neurologic: Alert and oriented x 4.   FHT:  Baseline 130 , moderate variability, accelerations present, no decelerations   Labs: No results found for this or any previous visit (from the past 24 hour(s)).  MAU Course: - Reactive NST  MDM: Plan of care reviewed with patient, including labs and tests ordered and medical treatment.  Assessment: 35 y.o. [id number] with IUP at [redacted]w[redacted]d and h/o chronic HTN, getting weekly BPP. Sent over for NST d/t BPP 6/8. Reactive NST here.   Plan: - Discharge home in stable condition.  - Follow up for routine PNV next week and growth U/S    Frederik Pear, MD 12/17/2015 6:59 PM   OB FELLOW MAU ATTESTATION  I have seen and examined this patient; I agree with above documentation in the resident's note.    Ernestina Penna 12/17/2015, 7:36 PM

## 2015-12-17 NOTE — Discharge Instructions (Signed)

## 2015-12-17 NOTE — MAU Note (Signed)
Sent from [facility] for NST.

## 2015-12-23 ENCOUNTER — Ambulatory Visit (INDEPENDENT_AMBULATORY_CARE_PROVIDER_SITE_OTHER): Payer: Medicaid Other | Admitting: Obstetrics and Gynecology

## 2015-12-23 VITALS — BP 119/78 | HR 89 | Temp 97.6°F | Wt 178.1 lb

## 2015-12-23 DIAGNOSIS — O133 Gestational [pregnancy-induced] hypertension without significant proteinuria, third trimester: Secondary | ICD-10-CM

## 2015-12-23 DIAGNOSIS — O0993 Supervision of high risk pregnancy, unspecified, third trimester: Secondary | ICD-10-CM

## 2015-12-23 NOTE — Progress Notes (Signed)
Subjective:  Kara Conner is a 35 y.o. G4P3003 at 98w0dbeing seen today for ongoing prenatal care.  She is currently monitored for the following issues for this high-risk pregnancy and has Supervision of high-risk pregnancy; Late prenatal care complicating pregnancy; and Transient hypertension of pregnancy, antepartum on her problem list.  Patient reports backache.  Contractions: Irritability. Vag. Bleeding: None.  Movement: Present. Denies leaking of fluid.   The following portions of the patient's history were reviewed and updated as appropriate: allergies, current medications, past family history, past medical history, past social history, past surgical history and problem list. Problem list updated.  Objective:   Vitals:   12/23/15 0901  BP: 119/78  Pulse: 89  Temp: 97.6 F (36.4 C)  Weight: 178 lb 1.6 oz (80.8 kg)    Fetal Status:     Movement: Present     General:  Alert, oriented and cooperative. Patient is in no acute distress.  Skin: Skin is warm and dry. No rash noted.   Cardiovascular: Normal heart rate noted  Respiratory: Normal respiratory effort, no problems with respiration noted  Abdomen: Soft, gravid, appropriate for gestational age. Pain/Pressure: Present     Pelvic:  Cervical exam deferred        Extremities: Normal range of motion.  Edema: None  Mental Status: Normal mood and affect. Normal behavior. Normal judgment and thought content.   Urinalysis:      Assessment and Plan:  Pregnancy: G4P3003 at 359w0d1. Supervision of high-risk pregnancy, third trimester She is taking more than 2000 gm of Tylenol a day for her LBP. Pt advised to decrease her Tylenol. Tx options for her LBP reviewed. Will check CMP due to excessive Tylenol use. GBS not completed at last weeks visit. Will complete at next visit 2. Transient hypertension of pregnancy, antepartum, third trimester Stable with Procardia. Will continue. - Comp Met (CMET)  Preterm labor symptoms and general  obstetric precautions including but not limited to vaginal bleeding, contractions, leaking of fluid and fetal movement were reviewed in detail with the patient. Please refer to After Visit Summary for other counseling recommendations.  Return in about 1 week (around 12/30/2015) for OB visit.   MiChancy MilroyMD

## 2015-12-23 NOTE — Patient Instructions (Signed)

## 2015-12-24 ENCOUNTER — Other Ambulatory Visit (HOSPITAL_COMMUNITY): Payer: Self-pay | Admitting: Maternal and Fetal Medicine

## 2015-12-24 ENCOUNTER — Encounter (HOSPITAL_COMMUNITY): Payer: Self-pay

## 2015-12-24 ENCOUNTER — Ambulatory Visit (HOSPITAL_COMMUNITY)
Admission: RE | Admit: 2015-12-24 | Discharge: 2015-12-24 | Disposition: A | Discharge status: Home / Self Care | Payer: Medicaid Other | Source: Ambulatory Visit | Attending: Obstetrics | Admitting: Obstetrics

## 2015-12-24 DIAGNOSIS — O09293 Supervision of pregnancy with other poor reproductive or obstetric history, third trimester: Secondary | ICD-10-CM | POA: Diagnosis not present

## 2015-12-24 DIAGNOSIS — IMO0002 Reserved for concepts with insufficient information to code with codable children: Secondary | ICD-10-CM

## 2015-12-24 DIAGNOSIS — O365930 Maternal care for other known or suspected poor fetal growth, third trimester, not applicable or unspecified: Secondary | ICD-10-CM | POA: Diagnosis present

## 2015-12-24 DIAGNOSIS — O09523 Supervision of elderly multigravida, third trimester: Secondary | ICD-10-CM

## 2015-12-24 DIAGNOSIS — Z3A35 35 weeks gestation of pregnancy: Secondary | ICD-10-CM | POA: Diagnosis not present

## 2015-12-24 LAB — COMPREHENSIVE METABOLIC PANEL
A/G RATIO: 1.3 (ref 1.2–2.2)
ALBUMIN: 3.4 g/dL — AB (ref 3.5–5.5)
ALK PHOS: 141 IU/L — AB (ref 39–117)
ALT: 14 IU/L (ref 0–32)
AST: 19 IU/L (ref 0–40)
BILIRUBIN TOTAL: 0.2 mg/dL (ref 0.0–1.2)
BUN / CREAT RATIO: 16 (ref 9–23)
BUN: 9 mg/dL (ref 6–20)
CHLORIDE: 101 mmol/L (ref 96–106)
CO2: 20 mmol/L (ref 18–29)
Calcium: 8.6 mg/dL — ABNORMAL LOW (ref 8.7–10.2)
Creatinine, Ser: 0.55 mg/dL — ABNORMAL LOW (ref 0.57–1.00)
GFR calc non Af Amer: 120 mL/min/{1.73_m2} (ref >59)
GFR, EST AFRICAN AMERICAN: 139 mL/min/{1.73_m2} (ref >59)
GLUCOSE: 85 mg/dL (ref 65–99)
Globulin, Total: 2.6 g/dL (ref 1.5–4.5)
POTASSIUM: 3.8 mmol/L (ref 3.5–5.2)
Sodium: 139 mmol/L (ref 134–144)
TOTAL PROTEIN: 6 g/dL (ref 6.0–8.5)

## 2015-12-30 ENCOUNTER — Ambulatory Visit (INDEPENDENT_AMBULATORY_CARE_PROVIDER_SITE_OTHER): Payer: Medicaid Other | Admitting: Family Medicine

## 2015-12-30 DIAGNOSIS — O0993 Supervision of high risk pregnancy, unspecified, third trimester: Secondary | ICD-10-CM

## 2015-12-30 LAB — OB RESULTS CONSOLE GBS: STREP GROUP B AG: NEGATIVE

## 2015-12-30 NOTE — Patient Instructions (Signed)
Third Trimester of Pregnancy The third trimester is from week 29 through week 42, months 7 through 9. The third trimester is a time when the fetus is growing rapidly. At the end of the ninth month, the fetus is about 20 inches in length and weighs 6-10 pounds.  BODY CHANGES Your body goes through many changes during pregnancy. The changes vary from woman to woman.   Your weight will continue to increase. You can expect to gain 25-35 pounds (11-16 kg) by the end of the pregnancy.  You may begin to get stretch marks on your hips, abdomen, and breasts.  You may urinate more often because the fetus is moving lower into your pelvis and pressing on your bladder.  You may develop or continue to have heartburn as a result of your pregnancy.  You may develop constipation because certain hormones are causing the muscles that push waste through your intestines to slow down.  You may develop hemorrhoids or swollen, bulging veins (varicose veins).  You may have pelvic pain because of the weight gain and pregnancy hormones relaxing your joints between the bones in your pelvis. Backaches may result from overexertion of the muscles supporting your posture.  You may have changes in your hair. These can include thickening of your hair, rapid growth, and changes in texture. Some women also have hair loss during or after pregnancy, or hair that feels dry or thin. Your hair will most likely return to normal after your baby is born.  Your breasts will continue to grow and be tender. A yellow discharge may leak from your breasts called colostrum.  Your belly button may stick out.  You may feel short of breath because of your expanding uterus.  You may notice the fetus "dropping," or moving lower in your abdomen.  You may have a bloody mucus discharge. This usually occurs a few days to a week before labor begins.  Your cervix becomes thin and soft (effaced) near your due date. WHAT TO EXPECT AT YOUR  PRENATAL EXAMS  You will have prenatal exams every 2 weeks until week 36. Then, you will have weekly prenatal exams. During a routine prenatal visit:  You will be weighed to make sure you and the fetus are growing normally.  Your blood pressure is taken.  Your abdomen will be measured to track your baby's growth.  The fetal heartbeat will be listened to.  Any test results from the previous visit will be discussed.  You may have a cervical check near your due date to see if you have effaced. At around 36 weeks, your caregiver will check your cervix. At the same time, your caregiver will also perform a test on the secretions of the vaginal tissue. This test is to determine if a type of bacteria, Group B streptococcus, is present. Your caregiver will explain this further. Your caregiver may ask you:  What your birth plan is.  How you are feeling.  If you are feeling the baby move.  If you have had any abnormal symptoms, such as leaking fluid, bleeding, severe headaches, or abdominal cramping.  If you are using any tobacco products, including cigarettes, chewing tobacco, and electronic cigarettes.  If you have any questions. Other tests or screenings that may be performed during your third trimester include:  Blood tests that check for low iron levels (anemia).  Fetal testing to check the health, activity level, and growth of the fetus. Testing is done if you have certain medical conditions or if   there are problems during the pregnancy.  HIV (human immunodeficiency virus) testing. If you are at high risk, you may be screened for HIV during your third trimester of pregnancy. FALSE LABOR You may feel small, irregular contractions that eventually go away. These are called Braxton Hicks contractions, or false labor. Contractions may last for hours, days, or even weeks before true labor sets in. If contractions come at regular intervals, intensify, or become painful, it is best to be seen  by your caregiver.  SIGNS OF LABOR   Menstrual-like cramps.  Contractions that are 5 minutes apart or less.  Contractions that start on the top of the uterus and spread down to the lower abdomen and back.  A sense of increased pelvic pressure or back pain.  A watery or bloody mucus discharge that comes from the vagina. If you have any of these signs before the 37th week of pregnancy, call your caregiver right away. You need to go to the hospital to get checked immediately. HOME CARE INSTRUCTIONS   Avoid all smoking, herbs, alcohol, and unprescribed drugs. These chemicals affect the formation and growth of the baby.  Do not use any tobacco products, including cigarettes, chewing tobacco, and electronic cigarettes. If you need help quitting, ask your health care provider. You may receive counseling support and other resources to help you quit.  Follow your caregiver's instructions regarding medicine use. There are medicines that are either safe or unsafe to take during pregnancy.  Exercise only as directed by your caregiver. Experiencing uterine cramps is a good sign to stop exercising.  Continue to eat regular, healthy meals.  Wear a good support bra for breast tenderness.  Do not use hot tubs, steam rooms, or saunas.  Wear your seat belt at all times when driving.  Avoid raw meat, uncooked cheese, cat litter boxes, and soil used by cats. These carry germs that can cause birth defects in the baby.  Take your prenatal vitamins.  Take 1500-2000 mg of calcium daily starting at the 20th week of pregnancy until you deliver your baby.  Try taking a stool softener (if your caregiver approves) if you develop constipation. Eat more high-fiber foods, such as fresh vegetables or fruit and whole grains. Drink plenty of fluids to keep your urine clear or pale yellow.  Take warm sitz baths to soothe any pain or discomfort caused by hemorrhoids. Use hemorrhoid cream if your caregiver  approves.  If you develop varicose veins, wear support hose. Elevate your feet for 15 minutes, 3-4 times a day. Limit salt in your diet.  Avoid heavy lifting, wear low heal shoes, and practice good posture.  Rest a lot with your legs elevated if you have leg cramps or low back pain.  Visit your dentist if you have not gone during your pregnancy. Use a soft toothbrush to brush your teeth and be gentle when you floss.  A sexual relationship may be continued unless your caregiver directs you otherwise.  Do not travel far distances unless it is absolutely necessary and only with the approval of your caregiver.  Take prenatal classes to understand, practice, and ask questions about the labor and delivery.  Make a trial run to the hospital.  Pack your hospital bag.  Prepare the baby's nursery.  Continue to go to all your prenatal visits as directed by your caregiver. SEEK MEDICAL CARE IF:  You are unsure if you are in labor or if your water has broken.  You have dizziness.  You have   mild pelvic cramps, pelvic pressure, or nagging pain in your abdominal area.  You have persistent nausea, vomiting, or diarrhea.  You have a bad smelling vaginal discharge.  You have pain with urination. SEEK IMMEDIATE MEDICAL CARE IF:   You have a fever.  You are leaking fluid from your vagina.  You have spotting or bleeding from your vagina.  You have severe abdominal cramping or pain.  You have rapid weight loss or gain.  You have shortness of breath with chest pain.  You notice sudden or extreme swelling of your face, hands, ankles, feet, or legs.  You have not felt your baby move in over an hour.  You have severe headaches that do not go away with medicine.  You have vision changes.   This information is not intended to replace advice given to you by your health care provider. Make sure you discuss any questions you have with your health care provider.   Document Released:  03/21/2001 Document Revised: 04/17/2014 Document Reviewed: 05/28/2012 Elsevier Interactive Patient Education 2016 [employer]  Breastfeeding Deciding to breastfeed is one of the best choices you can make for you and your baby. A change in hormones during pregnancy causes your breast tissue to grow and increases the number and size of your milk ducts. These hormones also allow proteins, sugars, and fats from your blood supply to make breast milk in your milk-producing glands. Hormones prevent breast milk from being released before your baby is born as well as prompt milk flow after birth. Once breastfeeding has begun, thoughts of your baby, as well as his or her sucking or crying, can stimulate the release of milk from your milk-producing glands.  BENEFITS OF BREASTFEEDING For Your Baby  Your first milk (colostrum) helps your baby's digestive system function better.  There are antibodies in your milk that help your baby fight off infections.  Your baby has a lower incidence of asthma, allergies, and sudden infant death syndrome.  The nutrients in breast milk are better for your baby than infant formulas and are designed uniquely for your baby's needs.  Breast milk improves your baby's brain development.  Your baby is less likely to develop other conditions, such as childhood obesity, asthma, or type 2 diabetes mellitus. For You  Breastfeeding helps to create a very special bond between you and your baby.  Breastfeeding is convenient. Breast milk is always available at the correct temperature and costs nothing.  Breastfeeding helps to burn calories and helps you lose the weight gained during pregnancy.  Breastfeeding makes your uterus contract to its prepregnancy size faster and slows bleeding (lochia) after you give birth.   Breastfeeding helps to lower your risk of developing type 2 diabetes mellitus, osteoporosis, and breast or ovarian cancer later in life. SIGNS THAT YOUR BABY IS  HUNGRY Early Signs of Hunger  Increased alertness or activity.  Stretching.  Movement of the head from side to side.  Movement of the head and opening of the mouth when the corner of the mouth or cheek is stroked (rooting).  Increased sucking sounds, smacking lips, cooing, sighing, or squeaking.  Hand-to-mouth movements.  Increased sucking of fingers or hands. Late Signs of Hunger  Fussing.  Intermittent crying. Extreme Signs of Hunger Signs of extreme hunger will require calming and consoling before your baby will be able to breastfeed successfully. Do not wait for the following signs of extreme hunger to occur before you initiate breastfeeding:  Restlessness.  A loud, strong cry.  Screaming.   BREASTFEEDING BASICS Breastfeeding Initiation  Find a comfortable place to sit or lie down, with your neck and back well supported.  Place a pillow or rolled up blanket under your baby to bring him or her to the level of your breast (if you are seated). Nursing pillows are specially designed to help support your arms and your baby while you breastfeed.  Make sure that your baby's abdomen is facing your abdomen.  Gently massage your breast. With your fingertips, massage from your chest wall toward your nipple in a circular motion. This encourages milk flow. You may need to continue this action during the feeding if your milk flows slowly.  Support your breast with 4 fingers underneath and your thumb above your nipple. Make sure your fingers are well away from your nipple and your baby's mouth.  Stroke your baby's lips gently with your finger or nipple.  When your baby's mouth is open wide enough, quickly bring your baby to your breast, placing your entire nipple and as much of the colored area around your nipple (areola) as possible into your baby's mouth.  More areola should be visible above your baby's upper lip than below the lower lip.  Your baby's tongue should be between his  or her lower gum and your breast.  Ensure that your baby's mouth is correctly positioned around your nipple (latched). Your baby's lips should create a seal on your breast and be turned out (everted).  It is common for your baby to suck about 2-3 minutes in order to start the flow of breast milk. Latching Teaching your baby how to latch on to your breast properly is very important. An improper latch can cause nipple pain and decreased milk supply for you and poor weight gain in your baby. Also, if your baby is not latched onto your nipple properly, he or she may swallow some air during feeding. This can make your baby fussy. Burping your baby when you switch breasts during the feeding can help to get rid of the air. However, teaching your baby to latch on properly is still the best way to prevent fussiness from swallowing air while breastfeeding. Signs that your baby has successfully latched on to your nipple:  Silent tugging or silent sucking, without causing you pain.  Swallowing heard between every 3-4 sucks.  Muscle movement above and in front of his or her ears while sucking. Signs that your baby has not successfully latched on to nipple:  Sucking sounds or smacking sounds from your baby while breastfeeding.  Nipple pain. If you think your baby has not latched on correctly, slip your finger into the corner of your baby's mouth to break the suction and place it between your baby's gums. Attempt breastfeeding initiation again. Signs of Successful Breastfeeding Signs from your baby:  A gradual decrease in the number of sucks or complete cessation of sucking.  Falling asleep.  Relaxation of his or her body.  Retention of a small amount of milk in his or her mouth.  Letting go of your breast by himself or herself. Signs from you:  Breasts that have increased in firmness, weight, and size 1-3 hours after feeding.  Breasts that are softer immediately after  breastfeeding.  Increased milk volume, as well as a change in milk consistency and color by the fifth day of breastfeeding.  Nipples that are not sore, cracked, or bleeding. Signs That Your Baby is Getting Enough Milk  Wetting at least 3 diapers in a 24-hour period.   The urine should be clear and pale yellow by age 5 days.  At least 3 stools in a 24-hour period by age 5 days. The stool should be soft and yellow.  At least 3 stools in a 24-hour period by age 7 days. The stool should be seedy and yellow.  No loss of weight greater than 10% of birth weight during the first 3 days of age.  Average weight gain of 4-7 ounces (113-198 g) per week after age 4 days.  Consistent daily weight gain by age 5 days, without weight loss after the age of 2 weeks. After a feeding, your baby may spit up a small amount. This is common. BREASTFEEDING FREQUENCY AND DURATION Frequent feeding will help you make more milk and can prevent sore nipples and breast engorgement. Breastfeed when you feel the need to reduce the fullness of your breasts or when your baby shows signs of hunger. This is called "breastfeeding on demand." Avoid introducing a pacifier to your baby while you are working to establish breastfeeding (the first 4-6 weeks after your baby is born). After this time you may choose to use a pacifier. Research has shown that pacifier use during the first year of a baby's life decreases the risk of sudden infant death syndrome (SIDS). Allow your baby to feed on each breast as long as he or she wants. Breastfeed until your baby is finished feeding. When your baby unlatches or falls asleep while feeding from the first breast, offer the second breast. Because newborns are often sleepy in the first few weeks of life, you may need to awaken your baby to get him or her to feed. Breastfeeding times will vary from baby to baby. However, the following rules can serve as a guide to help you ensure that your baby is  properly fed:  Newborns (babies 4 weeks of age or younger) may breastfeed every 1-3 hours.  Newborns should not go longer than 3 hours during the day or 5 hours during the night without breastfeeding.  You should breastfeed your baby a minimum of 8 times in a 24-hour period until you begin to introduce solid foods to your baby at around 6 months of age. BREAST MILK PUMPING Pumping and storing breast milk allows you to ensure that your baby is exclusively fed your breast milk, even at times when you are unable to breastfeed. This is especially important if you are going back to work while you are still breastfeeding or when you are not able to be present during feedings. Your lactation consultant can give you guidelines on how long it is safe to store breast milk. A breast pump is a machine that allows you to pump milk from your breast into a sterile bottle. The pumped breast milk can then be stored in a refrigerator or freezer. Some breast pumps are operated by hand, while others use electricity. Ask your lactation consultant which type will work best for you. Breast pumps can be purchased, but some hospitals and breastfeeding support groups lease breast pumps on a monthly basis. A lactation consultant can teach you how to hand express breast milk, if you prefer not to use a pump. CARING FOR YOUR BREASTS WHILE YOU BREASTFEED Nipples can become dry, cracked, and sore while breastfeeding. The following recommendations can help keep your breasts moisturized and healthy:  Avoid using soap on your nipples.  Wear a supportive bra. Although not required, special nursing bras and tank tops are designed to allow access to your   breasts for breastfeeding without taking off your entire bra or top. Avoid wearing underwire-style bras or extremely tight bras.  Air dry your nipples for 3-4minutes after each feeding.  Use only cotton bra pads to absorb leaked breast milk. Leaking of breast milk between feedings  is normal.  Use lanolin on your nipples after breastfeeding. Lanolin helps to maintain your skin's normal moisture barrier. If you use pure lanolin, you do not need to wash it off before feeding your baby again. Pure lanolin is not toxic to your baby. You may also hand express a few drops of breast milk and gently massage that milk into your nipples and allow the milk to air dry. In the first few weeks after giving birth, some women experience extremely full breasts (engorgement). Engorgement can make your breasts feel heavy, warm, and tender to the touch. Engorgement peaks within 3-5 days after you give birth. The following recommendations can help ease engorgement:  Completely empty your breasts while breastfeeding or pumping. You may want to start by applying warm, moist heat (in the shower or with warm water-soaked hand towels) just before feeding or pumping. This increases circulation and helps the milk flow. If your baby does not completely empty your breasts while breastfeeding, pump any extra milk after he or she is finished.  Wear a snug bra (nursing or regular) or tank top for 1-2 days to signal your body to slightly decrease milk production.  Apply ice packs to your breasts, unless this is too uncomfortable for you.  Make sure that your baby is latched on and positioned properly while breastfeeding. If engorgement persists after 48 hours of following these recommendations, contact your health care provider or a lactation consultant. OVERALL HEALTH CARE RECOMMENDATIONS WHILE BREASTFEEDING  Eat healthy foods. Alternate between meals and snacks, eating 3 of each per day. Because what you eat affects your breast milk, some of the foods may make your baby more irritable than usual. Avoid eating these foods if you are sure that they are negatively affecting your baby.  Drink milk, fruit juice, and water to satisfy your thirst (about 10 glasses a day).  Rest often, relax, and continue to take  your prenatal vitamins to prevent fatigue, stress, and anemia.  Continue breast self-awareness checks.  Avoid chewing and smoking tobacco. Chemicals from cigarettes that pass into breast milk and exposure to secondhand smoke may harm your baby.  Avoid alcohol and drug use, including marijuana. Some medicines that may be harmful to your baby can pass through breast milk. It is important to ask your health care provider before taking any medicine, including all over-the-counter and prescription medicine as well as vitamin and herbal supplements. It is possible to become pregnant while breastfeeding. If birth control is desired, ask your health care provider about options that will be safe for your baby. SEEK MEDICAL CARE IF:  You feel like you want to stop breastfeeding or have become frustrated with breastfeeding.  You have painful breasts or nipples.  Your nipples are cracked or bleeding.  Your breasts are red, tender, or warm.  You have a swollen area on either breast.  You have a fever or chills.  You have nausea or vomiting.  You have drainage other than breast milk from your nipples.  Your breasts do not become full before feedings by the fifth day after you give birth.  You feel sad and depressed.  Your baby is too sleepy to eat well.  Your baby is having trouble sleeping.     Your baby is wetting less than 3 diapers in a 24-hour period.  Your baby has less than 3 stools in a 24-hour period.  Your baby's skin or the white part of his or her eyes becomes yellow.   Your baby is not gaining weight by 5 days of age. SEEK IMMEDIATE MEDICAL CARE IF:  Your baby is overly tired (lethargic) and does not want to wake up and feed.  Your baby develops an unexplained fever.   This information is not intended to replace advice given to you by your health care provider. Make sure you discuss any questions you have with your health care provider.   Document Released: 03/27/2005  Document Revised: 12/16/2014 Document Reviewed: 09/18/2012 Elsevier Interactive Patient Education 2016 [employer]  

## 2015-12-30 NOTE — Progress Notes (Signed)
Patient is doing well- she does have contractions- but nothing bothersome.

## 2015-12-30 NOTE — Progress Notes (Signed)
   PRENATAL VISIT NOTE  Subjective:  Kara Conner is a 35 y.o. G4P3003 at 4880w0d being seen today for ongoing prenatal care.  She is currently monitored for the following issues for this low-risk pregnancy and has Supervision of high-risk pregnancy and Late prenatal care complicating pregnancy on her problem list.  Patient reports no complaints.  Contractions: Not present. Vag. Bleeding: None.  Movement: Present. Denies leaking of fluid.   The following portions of the patient's history were reviewed and updated as appropriate: allergies, current medications, past family history, past medical history, past social history, past surgical history and problem list. Problem list updated.  Objective:   Vitals:   12/30/15 1507  BP: 127/85  Pulse: 74  Temp: 98.3 F (36.8 C)  Weight: 178 lb 3.2 oz (80.8 kg)    Fetal Status: Fetal Heart Rate (bpm): 143 Fundal Height: 34 cm Movement: Present     General:  Alert, oriented and cooperative. Patient is in no acute distress.  Skin: Skin is warm and dry. No rash noted.   Cardiovascular: Normal heart rate noted  Respiratory: Normal respiratory effort, no problems with respiration noted  Abdomen: Soft, gravid, appropriate for gestational age. Pain/Pressure: Present     Pelvic:  Cervical exam deferred        Extremities: Normal range of motion.  Edema: None  Mental Status: Normal mood and affect. Normal behavior. Normal judgment and thought content.   Urinalysis: Urine Protein: Trace Urine Glucose: Trace  Assessment and Plan:  Pregnancy: G4P3003 at 380w0d  1. Supervision of high-risk pregnancy, third trimester - Strep Gp B NAA  Preterm labor symptoms and general obstetric precautions including but not limited to vaginal bleeding, contractions, leaking of fluid and fetal movement were reviewed in detail with the patient. Please refer to After Visit Summary for other counseling recommendations.  Return in 1 week (on 01/06/2016).  Reva Boresanya S Masashi Snowdon,  MD

## 2016-01-01 LAB — STREP GP B NAA: Strep Gp B NAA: NEGATIVE

## 2016-01-07 ENCOUNTER — Encounter (HOSPITAL_COMMUNITY): Payer: Self-pay

## 2016-01-07 ENCOUNTER — Ambulatory Visit (INDEPENDENT_AMBULATORY_CARE_PROVIDER_SITE_OTHER): Payer: Medicaid Other | Admitting: Obstetrics

## 2016-01-07 ENCOUNTER — Inpatient Hospital Stay (HOSPITAL_COMMUNITY)
Admission: AD | Admit: 2016-01-07 | Discharge: 2016-01-07 | Disposition: A | Discharge status: Home / Self Care | Payer: Medicaid Other | Source: Ambulatory Visit | Attending: Obstetrics and Gynecology | Admitting: Obstetrics and Gynecology

## 2016-01-07 VITALS — BP 115/80 | HR 71

## 2016-01-07 DIAGNOSIS — O0993 Supervision of high risk pregnancy, unspecified, third trimester: Secondary | ICD-10-CM

## 2016-01-07 DIAGNOSIS — O26893 Other specified pregnancy related conditions, third trimester: Secondary | ICD-10-CM

## 2016-01-07 DIAGNOSIS — R51 Headache: Secondary | ICD-10-CM | POA: Diagnosis not present

## 2016-01-07 DIAGNOSIS — Z7982 Long term (current) use of aspirin: Secondary | ICD-10-CM | POA: Diagnosis not present

## 2016-01-07 DIAGNOSIS — Z3A37 37 weeks gestation of pregnancy: Secondary | ICD-10-CM | POA: Insufficient documentation

## 2016-01-07 DIAGNOSIS — O10013 Pre-existing essential hypertension complicating pregnancy, third trimester: Secondary | ICD-10-CM | POA: Diagnosis not present

## 2016-01-07 DIAGNOSIS — Z3493 Encounter for supervision of normal pregnancy, unspecified, third trimester: Secondary | ICD-10-CM

## 2016-01-07 DIAGNOSIS — G44009 Cluster headache syndrome, unspecified, not intractable: Secondary | ICD-10-CM

## 2016-01-07 DIAGNOSIS — Z3689 Encounter for other specified antenatal screening: Secondary | ICD-10-CM

## 2016-01-07 LAB — CBC
HEMATOCRIT: 35.3 % — AB (ref 36.0–46.0)
Hemoglobin: 11.6 g/dL — ABNORMAL LOW (ref 12.0–15.0)
MCH: 28.2 pg (ref 26.0–34.0)
MCHC: 32.9 g/dL (ref 30.0–36.0)
MCV: 85.9 fL (ref 78.0–100.0)
Platelets: 225 10*3/uL (ref 150–400)
RBC: 4.11 MIL/uL (ref 3.87–5.11)
RDW: 14.2 % (ref 11.5–15.5)
WBC: 12.3 10*3/uL — ABNORMAL HIGH (ref 4.0–10.5)

## 2016-01-07 LAB — COMPREHENSIVE METABOLIC PANEL
ALT: 14 U/L (ref 14–54)
ANION GAP: 8 (ref 5–15)
AST: 19 U/L (ref 15–41)
Albumin: 2.9 g/dL — ABNORMAL LOW (ref 3.5–5.0)
Alkaline Phosphatase: 159 U/L — ABNORMAL HIGH (ref 38–126)
BILIRUBIN TOTAL: 0.4 mg/dL (ref 0.3–1.2)
BUN: 10 mg/dL (ref 6–20)
CO2: 23 mmol/L (ref 22–32)
Calcium: 8.7 mg/dL — ABNORMAL LOW (ref 8.9–10.3)
Chloride: 103 mmol/L (ref 101–111)
Creatinine, Ser: 0.53 mg/dL (ref 0.44–1.00)
GFR calc Af Amer: >60 mL/min (ref >60)
Glucose, Bld: 76 mg/dL (ref 65–99)
POTASSIUM: 4.1 mmol/L (ref 3.5–5.1)
Sodium: 134 mmol/L — ABNORMAL LOW (ref 135–145)
TOTAL PROTEIN: 7 g/dL (ref 6.5–8.1)

## 2016-01-07 LAB — URINALYSIS, ROUTINE W REFLEX MICROSCOPIC
BILIRUBIN URINE: NEGATIVE
GLUCOSE, UA: NEGATIVE mg/dL
HGB URINE DIPSTICK: NEGATIVE
KETONES UR: 15 mg/dL — AB
NITRITE: NEGATIVE
PROTEIN: NEGATIVE mg/dL
pH: 5.5 (ref 5.0–8.0)

## 2016-01-07 LAB — URINE MICROSCOPIC-ADD ON

## 2016-01-07 LAB — PROTEIN / CREATININE RATIO, URINE
Creatinine, Urine: 203 mg/dL
Protein Creatinine Ratio: 0.12 mg/mg{Cre} (ref 0.00–0.15)
Total Protein, Urine: 24 mg/dL

## 2016-01-07 MED ORDER — METOCLOPRAMIDE HCL 10 MG PO TABS
10.0000 mg | ORAL_TABLET | Freq: Once | ORAL | Status: AC
  Administered 2016-01-07: 10 mg via ORAL
  Filled 2016-01-07: qty 1

## 2016-01-07 MED ORDER — DIPHENHYDRAMINE HCL 25 MG PO CAPS
25.0000 mg | ORAL_CAPSULE | Freq: Once | ORAL | Status: AC
  Administered 2016-01-07: 25 mg via ORAL
  Filled 2016-01-07: qty 1

## 2016-01-07 MED ORDER — BUTALBITAL-APAP-CAFFEINE 50-325-40 MG PO TABS: 2.0000 | ORAL_TABLET | Freq: Four times a day (QID) | ORAL | 2 refills | Status: DC | PRN

## 2016-01-07 MED ORDER — ACETAMINOPHEN 500 MG PO TABS
1000.0000 mg | ORAL_TABLET | Freq: Once | ORAL | Status: AC
  Administered 2016-01-07: 1000 mg via ORAL
  Filled 2016-01-07: qty 2

## 2016-01-07 NOTE — Discharge Instructions (Signed)

## 2016-01-07 NOTE — MAU Provider Note (Signed)
History     CSN: [id number]  Arrival date and time: 01/07/16 1626   First Provider Initiated Contact with Patient 01/07/16 1746      Chief Complaint  Patient presents with  . Contractions  . Hypertension   G4P3003 @37 .1 weeks sent from office for frontal HA and not feeling well. HA started yesterday and she took Fioricet. She reports little relief yesterday but HA better today. She denies visual disturbances and epigastric pain. She reports good FM. No LOF, VB, or ctx. She has hx of chronic HTN stable on Procardia.    OB History    Gravida Para Term Preterm AB Living   4 3 3     3    SAB TAB Ectopic Multiple Live Births           3      Past Medical History:  Diagnosis Date  . Hypertension    1st pregnancy for preeclampsia    History reviewed. No pertinent surgical history.  History reviewed. No pertinent family history.  Social History  Substance Use Topics  . Smoking status: Never Smoker  . Smokeless tobacco: Never Used  . Alcohol use No    Allergies: No Known Allergies  Prescriptions Prior to Admission  Medication Sig Dispense Refill Last Dose  . acetaminophen (TYLENOL) 500 MG tablet Take 1,000 mg by mouth every 6 (six) hours as needed for mild pain, moderate pain or headache.    Taking  . aspirin EC 81 MG tablet Take 81 mg by mouth at bedtime.   Taking  . butalbital-acetaminophen-caffeine (FIORICET) 50-325-40 MG tablet Take 2 tablets by mouth every 6 (six) hours as needed for headache. 40 tablet 2   . NIFEdipine (PROCARDIA-XL/ADALAT-CC/NIFEDICAL-XL) 30 MG 24 hr tablet Take 30 mg by mouth at bedtime.   Taking  . ondansetron (ZOFRAN ODT) 8 MG disintegrating tablet Take 1 tablet (8 mg total) by mouth every 8 (eight) hours as needed for nausea. 30 tablet 2 Taking  . Prenat w/o A-FeCbn-Meth-FA-DHA (PRENATE MINI) 29-0.6-0.4-350 MG CAPS Take 1 capsule by mouth at bedtime.   Taking    Review of Systems  Constitutional: Negative.   Eyes: Negative.    Gastrointestinal: Negative.   Neurological: Positive for headaches.   Physical Exam   Blood pressure 109/77, pulse 94, temperature 98.1 F (36.7 C), resp. rate 16, height 5\' 5"  (1.651 m), weight 80.8 kg (178 lb 1.9 oz), last menstrual period 05/27/2015, unknown if currently breastfeeding. Vitals:   01/07/16 1804 01/07/16 1819 01/07/16 1834 01/07/16 1943  BP: 108/82 106/74 122/80 123/76  Pulse: 76 70 72 69  Resp:    17  Temp:    97.9 F (36.6 C)  TempSrc:    Oral  Weight:      Height:        Physical Exam  Constitutional: She is oriented to person, place, and time. She appears well-developed and well-nourished.  HENT:  Head: Normocephalic and atraumatic.  Neck: Normal range of motion. Neck supple.  Cardiovascular: Normal rate.   Respiratory: Effort normal.  GI: Soft. She exhibits no distension. There is no tenderness.  Musculoskeletal: Normal range of motion.  Neurological: She is alert and oriented to person, place, and time.  Skin: Skin is warm and dry.  Psychiatric: She has a normal mood and affect.  EFM: 125 bpm, mod variability, + accels, no decels Toco: none  Results for orders placed or performed during the hospital encounter of 01/07/16 (from the past 24 hour(s))  Urinalysis, Routine w  reflex microscopic (not at [facility])     Status: Abnormal   Collection Time: 01/07/16  4:50 PM  Result Value Ref Range   Color, Urine YELLOW YELLOW   APPearance HAZY (A) CLEAR   Specific Gravity, Urine >1.030 (H) 1.005 - 1.030   pH 5.5 5.0 - 8.0   Glucose, UA NEGATIVE NEGATIVE mg/dL   Hgb urine dipstick NEGATIVE NEGATIVE   Bilirubin Urine NEGATIVE NEGATIVE   Ketones, ur 15 (A) NEGATIVE mg/dL   Protein, ur NEGATIVE NEGATIVE mg/dL   Nitrite NEGATIVE NEGATIVE   Leukocytes, UA TRACE (A) NEGATIVE  Protein / creatinine ratio, urine     Status: None   Collection Time: 01/07/16  4:50 PM  Result Value Ref Range   Creatinine, Urine 203.00 mg/dL   Total Protein, Urine 24 mg/dL   Protein  Creatinine Ratio 0.12 0.00 - 0.15 mg/mg[Cre]  Urine microscopic-add on     Status: Abnormal   Collection Time: 01/07/16  4:50 PM  Result Value Ref Range   Squamous Epithelial / LPF TOO NUMEROUS TO COUNT (A) NONE SEEN   WBC, UA 6-30 0 - 5 WBC/hpf   RBC / HPF 0-5 0 - 5 RBC/hpf   Bacteria, UA MANY (A) NONE SEEN  CBC     Status: Abnormal   Collection Time: 01/07/16  5:17 PM  Result Value Ref Range   WBC 12.3 (H) 4.0 - 10.5 K/uL   RBC 4.11 3.87 - 5.11 MIL/uL   Hemoglobin 11.6 (L) 12.0 - 15.0 g/dL   HCT 40.9 (L) 81.1 - 91.4 %   MCV 85.9 78.0 - 100.0 fL   MCH 28.2 26.0 - 34.0 pg   MCHC 32.9 30.0 - 36.0 g/dL   RDW 78.2 95.6 - 21.3 %   Platelets 225 150 - 400 K/uL  Comprehensive metabolic panel     Status: Abnormal   Collection Time: 01/07/16  5:17 PM  Result Value Ref Range   Sodium 134 (L) 135 - 145 mmol/L   Potassium 4.1 3.5 - 5.1 mmol/L   Chloride 103 101 - 111 mmol/L   CO2 23 22 - 32 mmol/L   Glucose, Bld 76 65 - 99 mg/dL   BUN 10 6 - 20 mg/dL   Creatinine, Ser 0.86 0.44 - 1.00 mg/dL   Calcium 8.7 (L) 8.9 - 10.3 mg/dL   Total Protein 7.0 6.5 - 8.1 g/dL   Albumin 2.9 (L) 3.5 - 5.0 g/dL   AST 19 15 - 41 U/L   ALT 14 14 - 54 U/L   Alkaline Phosphatase 159 (H) 38 - 126 U/L   Total Bilirubin 0.4 0.3 - 1.2 mg/dL   GFR calc non Af Amer >60 >60 mL/min   GFR calc Af Amer >60 >60 mL/min   Anion gap 8 5 - 15    MAU Course  Procedures Tylenol 1 gm po x1 Reglan 10 mg po x1 Benadryl 25 mg po x1  MDM Labs ordered and reviewed. No evidence of pre-e. HA relieved after meds. Stable for discharge home.  Assessment and Plan   1. Headache in pregnancy, antepartum, third trimester   2. Third trimester pregnancy   3. NST (non-stress test) reactive    Discharge home Pre-e precautions Follow up at [facility] next week   Donette Larry, CNM 01/07/2016, 6:34 PM

## 2016-01-07 NOTE — MAU Note (Signed)
Today patient was sent by Dr. Clearance CootsHarper for [facility] evaluation, stating she "feels bad today" can only stand for short periods of time, history of migraine headaches feels on coming on, having irregular contractions, no vision changes, no pain under right breast, rates abdominal pain 5/10, has history of PIH with previous pregnancy.

## 2016-01-11 ENCOUNTER — Encounter: Payer: Self-pay | Admitting: Obstetrics

## 2016-01-12 NOTE — Progress Notes (Signed)
Subjective:    Kara Conner is a 35 y.o. female being seen today for her obstetrical visit. She is at 2215w6d gestation. Patient reports headache.  Fetal movement: normal.  Problem List Items Addressed This Visit    None    Visit Diagnoses    Cluster headache, not intractable, unspecified chronicity pattern       Relevant Medications   butalbital-acetaminophen-caffeine (FIORICET) 50-325-40 MG tablet     Patient Active Problem List   Diagnosis Date Noted  . Supervision of high-risk pregnancy 07/11/2012  . Late prenatal care complicating pregnancy 07/11/2012    Objective:    BP 115/80   Pulse 71   LMP 05/27/2015 (Approximate)  FHT: 150 BPM  Uterine Size: size equals dates  Presentations: cephalic    Assessment:    Pregnancy @ 8715w6d weeks    Headache  Plan:   Plans for delivery: Vaginal anticipated; labs reviewed; problem list updated.  [facility]. Counseling: Consent signed. Infant feeding: plans to breastfeed. Cigarette smoking: never smoked. L&D discussion: symptoms of labor, discussed when to call, discussed what number to call, anesthetic/analgesic options reviewed and delivering clinician:  plans no preference. Postpartum supports and preparation: circumcision discussed and contraception plans discussed.  Follow up in 1 Week.  Patient ID: Kara Gunneroni Codner, female   DOB: July 26, 1980, 35 y.o.   MRN: [id number]

## 2016-01-14 ENCOUNTER — Ambulatory Visit (INDEPENDENT_AMBULATORY_CARE_PROVIDER_SITE_OTHER): Payer: Medicaid Other | Admitting: Certified Nurse Midwife

## 2016-01-14 DIAGNOSIS — O0993 Supervision of high risk pregnancy, unspecified, third trimester: Secondary | ICD-10-CM

## 2016-01-14 NOTE — Progress Notes (Signed)
Patient stated that she has irregular contractions, no bleeding, and good fetal movement.

## 2016-01-14 NOTE — Addendum Note (Signed)
Addended by: Samantha CrimesENNEY, Carrieanne Kleen ANNE on: 01/14/2016 12:20 PM   Modules accepted: Orders, SmartSet

## 2016-01-14 NOTE — Progress Notes (Addendum)
Subjective:    Kara Conner is a 35 y.o. female being seen today for her obstetrical visit. She is at 4483w1d gestation. Patient reports no complaints. Fetal movement: normal.  Problem List Items Addressed This Visit      Other   Supervision of high-risk pregnancy    Other Visit Diagnoses   None.    Patient Active Problem List   Diagnosis Date Noted  . Supervision of high-risk pregnancy 07/11/2012  . Late prenatal care complicating pregnancy 07/11/2012    Objective:    BP 135/88   Pulse 80   Temp 98.2 F (36.8 C)   Wt 179 lb 9.6 oz (81.5 kg)   LMP 05/27/2015 (Approximate)   BMI 29.89 kg/m  FHT: 150 BPM  Uterine Size: 36 cm and size less than dates  Presentations: cephalic  Pelvic Exam: deferred     Assessment:    Pregnancy @ 3583w1d weeks   H/O SGA fetus  AMA Plan:    patient desires elective induction: lives and hour away, child care issues, etc.  Has always been induced.  Hx of SGA infants previously.  Fetus: left echogenic focus: negative panoroma.  Previously SGA: normal dopplers. Released from MFM.   Plans for delivery: Vaginal anticipated; labs reviewed; problem list updated Counseling: Consent signed. Infant feeding: plans to breastfeed. Cigarette smoking: never smoked. L&D discussion: symptoms of labor, discussed when to call, discussed what number to call, anesthetic/analgesic options reviewed and delivering clinician:  plans no preference. Postpartum supports and preparation: circumcision discussed and contraception plans discussed. IOL scheduled: 01/19/16 @0730 . Orders in computer.   Follow up in 1 Week.

## 2016-01-17 ENCOUNTER — Telehealth (HOSPITAL_COMMUNITY): Payer: Self-pay | Admitting: *Deleted

## 2016-01-17 ENCOUNTER — Encounter (HOSPITAL_COMMUNITY): Payer: Self-pay | Admitting: *Deleted

## 2016-01-17 NOTE — Telephone Encounter (Signed)
Preadmission screen  

## 2016-01-20 ENCOUNTER — Inpatient Hospital Stay (HOSPITAL_COMMUNITY): Payer: Medicaid Other | Admitting: Anesthesiology

## 2016-01-20 ENCOUNTER — Inpatient Hospital Stay (HOSPITAL_COMMUNITY)
Admission: RE | Admit: 2016-01-20 | Discharge: 2016-01-22 | DRG: 775 | Disposition: A | Discharge status: Home / Self Care | Payer: Medicaid Other | Source: Ambulatory Visit | Attending: Family Medicine | Admitting: Family Medicine

## 2016-01-20 ENCOUNTER — Encounter (HOSPITAL_COMMUNITY): Payer: Self-pay

## 2016-01-20 DIAGNOSIS — Z87891 Personal history of nicotine dependence: Secondary | ICD-10-CM | POA: Diagnosis not present

## 2016-01-20 DIAGNOSIS — IMO0002 Reserved for concepts with insufficient information to code with codable children: Secondary | ICD-10-CM | POA: Diagnosis present

## 2016-01-20 DIAGNOSIS — Z7982 Long term (current) use of aspirin: Secondary | ICD-10-CM

## 2016-01-20 DIAGNOSIS — O134 Gestational [pregnancy-induced] hypertension without significant proteinuria, complicating childbirth: Secondary | ICD-10-CM | POA: Diagnosis present

## 2016-01-20 DIAGNOSIS — Z8249 Family history of ischemic heart disease and other diseases of the circulatory system: Secondary | ICD-10-CM | POA: Diagnosis not present

## 2016-01-20 DIAGNOSIS — O4202 Full-term premature rupture of membranes, onset of labor within 24 hours of rupture: Secondary | ICD-10-CM | POA: Diagnosis not present

## 2016-01-20 DIAGNOSIS — Z3A39 39 weeks gestation of pregnancy: Secondary | ICD-10-CM

## 2016-01-20 LAB — CBC WITH DIFFERENTIAL/PLATELET
BASOS ABS: 0 10*3/uL (ref 0.0–0.1)
BASOS PCT: 0 %
Eosinophils Absolute: 0.1 10*3/uL (ref 0.0–0.7)
Eosinophils Relative: 1 %
HEMATOCRIT: 29.9 % — AB (ref 36.0–46.0)
HEMOGLOBIN: 9.9 g/dL — AB (ref 12.0–15.0)
LYMPHS PCT: 23 %
Lymphs Abs: 2 10*3/uL (ref 0.7–4.0)
MCH: 28.1 pg (ref 26.0–34.0)
MCHC: 33.1 g/dL (ref 30.0–36.0)
MCV: 84.9 fL (ref 78.0–100.0)
MONO ABS: 0.4 10*3/uL (ref 0.1–1.0)
Monocytes Relative: 5 %
NEUTROS ABS: 6 10*3/uL (ref 1.7–7.7)
NEUTROS PCT: 71 %
Platelets: 191 10*3/uL (ref 150–400)
RBC: 3.52 MIL/uL — AB (ref 3.87–5.11)
RDW: 14 % (ref 11.5–15.5)
WBC: 8.4 10*3/uL (ref 4.0–10.5)

## 2016-01-20 LAB — TYPE AND SCREEN
ABO/RH(D): A POS
ANTIBODY SCREEN: NEGATIVE

## 2016-01-20 LAB — URINALYSIS, ROUTINE W REFLEX MICROSCOPIC
Bilirubin Urine: NEGATIVE
GLUCOSE, UA: NEGATIVE mg/dL
KETONES UR: 15 mg/dL — AB
LEUKOCYTES UA: NEGATIVE
NITRITE: NEGATIVE
PH: 6 (ref 5.0–8.0)
Protein, ur: NEGATIVE mg/dL

## 2016-01-20 LAB — COMPREHENSIVE METABOLIC PANEL
ALBUMIN: 2.7 g/dL — AB (ref 3.5–5.0)
ALK PHOS: 180 U/L — AB (ref 38–126)
ALT: 16 U/L (ref 14–54)
ANION GAP: 7 (ref 5–15)
AST: 26 U/L (ref 15–41)
BILIRUBIN TOTAL: 0.7 mg/dL (ref 0.3–1.2)
BUN: 9 mg/dL (ref 6–20)
CALCIUM: 8.5 mg/dL — AB (ref 8.9–10.3)
CO2: 23 mmol/L (ref 22–32)
Chloride: 103 mmol/L (ref 101–111)
Creatinine, Ser: 0.49 mg/dL (ref 0.44–1.00)
GLUCOSE: 100 mg/dL — AB (ref 65–99)
POTASSIUM: 3.8 mmol/L (ref 3.5–5.1)
Sodium: 133 mmol/L — ABNORMAL LOW (ref 135–145)
TOTAL PROTEIN: 6.1 g/dL — AB (ref 6.5–8.1)

## 2016-01-20 LAB — URINE MICROSCOPIC-ADD ON

## 2016-01-20 LAB — CBC
HEMATOCRIT: 32.4 % — AB (ref 36.0–46.0)
HEMOGLOBIN: 10.7 g/dL — AB (ref 12.0–15.0)
MCH: 28.3 pg (ref 26.0–34.0)
MCHC: 33 g/dL (ref 30.0–36.0)
MCV: 85.7 fL (ref 78.0–100.0)
Platelets: 204 10*3/uL (ref 150–400)
RBC: 3.78 MIL/uL — ABNORMAL LOW (ref 3.87–5.11)
RDW: 14 % (ref 11.5–15.5)
WBC: 9.1 10*3/uL (ref 4.0–10.5)

## 2016-01-20 LAB — RAPID URINE DRUG SCREEN, HOSP PERFORMED
AMPHETAMINES: NOT DETECTED
BARBITURATES: POSITIVE — AB
BENZODIAZEPINES: NOT DETECTED
COCAINE: NOT DETECTED
Opiates: NOT DETECTED
TETRAHYDROCANNABINOL: NOT DETECTED

## 2016-01-20 LAB — ABO/RH: ABO/RH(D): A POS

## 2016-01-20 MED ORDER — PHENYLEPHRINE 40 MCG/ML (10ML) SYRINGE FOR IV PUSH (FOR BLOOD PRESSURE SUPPORT): 80.0000 ug | PREFILLED_SYRINGE | INTRAVENOUS | Status: DC | PRN

## 2016-01-20 MED ORDER — DIPHENHYDRAMINE HCL 50 MG/ML IJ SOLN: 12.5000 mg | INTRAMUSCULAR | Status: DC | PRN

## 2016-01-20 MED ORDER — OXYCODONE-ACETAMINOPHEN 5-325 MG PO TABS: 1.0000 | ORAL_TABLET | ORAL | Status: DC | PRN

## 2016-01-20 MED ORDER — LACTATED RINGERS IV SOLN
500.0000 mL | Freq: Once | INTRAVENOUS | Status: AC
  Administered 2016-01-20: 500 mL via INTRAVENOUS

## 2016-01-20 MED ORDER — PHENYLEPHRINE 40 MCG/ML (10ML) SYRINGE FOR IV PUSH (FOR BLOOD PRESSURE SUPPORT)
80.0000 ug | PREFILLED_SYRINGE | INTRAVENOUS | Status: DC | PRN
  Filled 2016-01-20: qty 10
  Filled 2016-01-20: qty 5

## 2016-01-20 MED ORDER — OXYTOCIN BOLUS FROM INFUSION
500.0000 mL | Freq: Once | INTRAVENOUS | Status: AC
  Administered 2016-01-21: 500 mL via INTRAVENOUS

## 2016-01-20 MED ORDER — LACTATED RINGERS IV SOLN: 500.0000 mL | INTRAVENOUS | Status: DC | PRN

## 2016-01-20 MED ORDER — EPHEDRINE 5 MG/ML INJ: 10.0000 mg | INTRAVENOUS | Status: DC | PRN

## 2016-01-20 MED ORDER — LACTATED RINGERS IV SOLN: 500.0000 mL | Freq: Once | INTRAVENOUS | Status: DC

## 2016-01-20 MED ORDER — ONDANSETRON HCL 4 MG/2ML IJ SOLN: 4.0000 mg | Freq: Four times a day (QID) | INTRAMUSCULAR | Status: DC | PRN

## 2016-01-20 MED ORDER — OXYCODONE-ACETAMINOPHEN 5-325 MG PO TABS: 2.0000 | ORAL_TABLET | ORAL | Status: DC | PRN

## 2016-01-20 MED ORDER — LIDOCAINE HCL (PF) 1 % IJ SOLN
INTRAMUSCULAR | Status: DC | PRN
  Administered 2016-01-20: 4 mL via EPIDURAL
  Administered 2016-01-20: 6 mL via EPIDURAL

## 2016-01-20 MED ORDER — ACETAMINOPHEN 325 MG PO TABS: 650.0000 mg | ORAL_TABLET | ORAL | Status: DC | PRN

## 2016-01-20 MED ORDER — OXYTOCIN 40 UNITS IN LACTATED RINGERS INFUSION - SIMPLE MED
2.5000 [IU]/h | INTRAVENOUS | Status: DC
  Administered 2016-01-21: 2.5 [IU]/h via INTRAVENOUS

## 2016-01-20 MED ORDER — FENTANYL 2.5 MCG/ML BUPIVACAINE 1/10 % EPIDURAL INFUSION (WH - ANES)
14.0000 mL/h | INTRAMUSCULAR | Status: DC | PRN
  Administered 2016-01-20: 14 mL/h via EPIDURAL
  Filled 2016-01-20: qty 125

## 2016-01-20 MED ORDER — MISOPROSTOL 25 MCG QUARTER TABLET
25.0000 ug | ORAL_TABLET | ORAL | Status: DC | PRN
  Administered 2016-01-20: 25 ug via VAGINAL
  Filled 2016-01-20: qty 1
  Filled 2016-01-20: qty 0.25

## 2016-01-20 MED ORDER — LIDOCAINE HCL (PF) 1 % IJ SOLN
30.0000 mL | INTRAMUSCULAR | Status: DC | PRN
  Filled 2016-01-20: qty 30

## 2016-01-20 MED ORDER — OXYTOCIN 40 UNITS IN LACTATED RINGERS INFUSION - SIMPLE MED
1.0000 m[IU]/min | INTRAVENOUS | Status: DC
  Administered 2016-01-20: 2 m[IU]/min via INTRAVENOUS
  Filled 2016-01-20: qty 1000

## 2016-01-20 MED ORDER — TERBUTALINE SULFATE 1 MG/ML IJ SOLN
0.2500 mg | Freq: Once | INTRAMUSCULAR | Status: DC | PRN
  Filled 2016-01-20: qty 1

## 2016-01-20 MED ORDER — FENTANYL CITRATE (PF) 100 MCG/2ML IJ SOLN: 100.0000 ug | INTRAMUSCULAR | Status: DC | PRN

## 2016-01-20 MED ORDER — PHENYLEPHRINE 40 MCG/ML (10ML) SYRINGE FOR IV PUSH (FOR BLOOD PRESSURE SUPPORT)
80.0000 ug | PREFILLED_SYRINGE | INTRAVENOUS | Status: DC | PRN
  Filled 2016-01-20: qty 5

## 2016-01-20 MED ORDER — EPHEDRINE 5 MG/ML INJ
10.0000 mg | INTRAVENOUS | Status: DC | PRN
  Filled 2016-01-20: qty 4

## 2016-01-20 MED ORDER — FENTANYL 2.5 MCG/ML BUPIVACAINE 1/10 % EPIDURAL INFUSION (WH - ANES): 14.0000 mL/h | INTRAMUSCULAR | Status: DC | PRN

## 2016-01-20 MED ORDER — EPHEDRINE 5 MG/ML INJ
10.0000 mg | INTRAVENOUS | Status: DC | PRN
Start: 2016-01-20 — End: 2016-01-21
  Filled 2016-01-20: qty 4

## 2016-01-20 MED ORDER — SOD CITRATE-CITRIC ACID 500-334 MG/5ML PO SOLN: 30.0000 mL | ORAL | Status: DC | PRN

## 2016-01-20 MED ORDER — OXYTOCIN 10 UNIT/ML IJ SOLN: 10.0000 [IU] | Freq: Once | INTRAMUSCULAR | Status: DC

## 2016-01-20 MED ORDER — LACTATED RINGERS IV SOLN
INTRAVENOUS | Status: DC
Start: 2016-01-20 — End: 2016-01-21
  Administered 2016-01-20: 10:00:00 via INTRAVENOUS
  Administered 2016-01-20: 125 mL/h via INTRAVENOUS

## 2016-01-20 NOTE — Anesthesia Procedure Notes (Signed)
Epidural Patient location during procedure: OB Start time: 01/20/2016 5:01 PM End time: 01/20/2016 5:08 PM  Staffing Anesthesiologist: Linton RumpALLAN, Desmen Schoffstall DICKERSON Performed: anesthesiologist   Preanesthetic Checklist Completed: patient identified, surgical consent, pre-op evaluation, timeout performed, IV checked, risks and benefits discussed and monitors and equipment checked  Epidural Patient position: sitting Prep: site prepped and draped and DuraPrep Patient monitoring: continuous pulse ox and blood pressure Approach: midline Location: L2-L3 Injection technique: LOR air  Needle:  Needle type: Tuohy  Needle gauge: 17 G Needle length: 9 cm and 9 Needle insertion depth: 4 cm Catheter type: closed end flexible Catheter size: 19 Gauge Catheter at skin depth: 9 cm Test dose: negative  Assessment Events: blood not aspirated, injection not painful, no injection resistance, negative IV test and no paresthesia  Additional Notes Epidural placed easily on first attempt.Reason for block:procedure for pain

## 2016-01-20 NOTE — Anesthesia Preprocedure Evaluation (Addendum)
Anesthesia Evaluation  Patient identified by MRN, date of birth, ID band Patient awake    Reviewed: Allergy & Precautions, NPO status , Patient's Chart, lab work & pertinent test results  Airway Mallampati: II  TM Distance: >3 FB Neck ROM: Full    Dental  (+) Teeth Intact   Pulmonary former smoker,    Pulmonary exam normal breath sounds clear to auscultation       Cardiovascular hypertension (gestational), Pt. on medications  Rhythm:Regular Rate:Normal     Neuro/Psych negative neurological ROS     GI/Hepatic negative GI ROS, Neg liver ROS,   Endo/Other  negative endocrine ROS  Renal/GU negative Renal ROS     Musculoskeletal   Abdominal   Peds  Hematology negative hematology ROS (+)   Anesthesia Other Findings   Reproductive/Obstetrics (+) Pregnancy                            Anesthesia Physical Anesthesia Plan  ASA: II  Anesthesia Plan: Epidural   Post-op Pain Management:    Induction:   Airway Management Planned: Natural Airway  Additional Equipment:   Intra-op Plan:   Post-operative Plan:   Informed Consent: I have reviewed the patients History and Physical, chart, labs and discussed the procedure including the risks, benefits and alternatives for the proposed anesthesia with the patient or authorized representative who has indicated his/her understanding and acceptance.     Plan Discussed with:   Anesthesia Plan Comments: (I have discussed risks of neuraxial anesthesia including but not limited to infection, bleeding, nerve injury, back pain, headache, seizures, and failure of block. Patient denies bleeding disorders and is not currently anticoagulated. Labs have been reviewed. Risks and benefits discussed. All patient's questions answered.   Platelets 204 )        Anesthesia Quick Evaluation

## 2016-01-20 NOTE — Progress Notes (Signed)
Patient resting comfortably in bed with epidural in place. Blood pressures well controlled at this time. BP 115/65   Pulse 74   Temp 97.6 F (36.4 C) (Oral)   Resp 18   Ht 5\' 5"  (1.651 m)   Wt 81.2 kg (179 lb)   LMP 05/27/2015 (Approximate)   SpO2 99%   BMI 29.79 kg/m  Denies headache, blurry vision, epigastric pain. FHR CAT 1 tracing; will start pitocin augmentation. Anticipate NSVD. Luna KitchensKathryn Kooistra CNM

## 2016-01-20 NOTE — Progress Notes (Signed)
Patient resting comfortably in bed; denies HA, blurry vision or epigastric pain. Pressures stable at this time. FHR Cat 1. Will continue to follow plan of care and anticipate NSVD. Luna KitchensKathryn Kooistra, CNM

## 2016-01-20 NOTE — Anesthesia Pain Management Evaluation Note (Signed)
  CRNA Pain Management Visit Note  Patient: Kara Conner, 35 y.o., female  "Hello I am a [facility] the anesthesia [facility]. We have an anesthesia team available at all times to provide care throughout the hospital, including epidural management and anesthesia for C-section. I don't know your plan for the delivery whether it a natural birth, water birth, IV sedation, nitrous supplementation, doula or epidural, but we want to meet your pain goals."   1.Was your pain managed to your expectations on prior hospitalizations?   3 previous epidurals; all provided some relief.  2.What is your expectation for pain management during this hospitalization?     Epidural  3.How can we help you reach that goal? Epidural when ready.  Record the patient's initial score and the patient's pain goal.   Pain: 4  Pain Goal: 6 [facility] wants you to be able to say your pain was always managed very well.  [name] L 01/20/2016

## 2016-01-20 NOTE — MAU Note (Signed)
Pt here for scheduled elective induction. Pt states baby is moving normally. Pt denies bleeding and leaking of fluid. Pt states she was 1cm last time she was checked. Pt states she has had high blood pressure with this pregnancy and has been taking Procardia.

## 2016-01-20 NOTE — H&P (Signed)
LABOR AND DELIVERY ADMISSION HISTORY AND PHYSICAL NOTE  Kara Conner is a 35 y.o. female [phone] with IUP at 8432w0d by 25 week ultrasound presenting for IOL for gestational hypertension. Her maternal history is significant for potential IUGR; her last ultrasound on 9/15 at 35 weeks revealed fetus in 43% at 2379 grams.   Has history of migraines and takes Fioricet at home, has had headaches on and off but not today. No RUQ pain, mild swelling, has seen some flashing lights in her vision lately not within past week. Feels well overall.  She reports positive fetal movement. She denies leakage of fluid or vaginal bleeding.  Prenatal History/Complications:  Past Medical History: Past Medical History:  Diagnosis Date  . Hypertension    1st pregnancy for preeclampsia    Past Surgical History: History reviewed. No pertinent surgical history.  Obstetrical History: OB History    Gravida Para Term Preterm AB Living   4 3 3     3    SAB TAB Ectopic Multiple Live Births           3      Social History: Social History   Social History  . Marital status: Married    Spouse name: N/A  . Number of children: N/A  . Years of education: N/A   Social History Main Topics  . Smoking status: Former Smoker    Packs/day: 1.00    Years: 20.00    Types: Cigarettes    Quit date: 04/21/2005  . Smokeless tobacco: Never Used  . Alcohol use No  . Drug use: No  . Sexual activity: Yes    Birth control/ protection: Implant     Comment: Nexplanon   Other Topics Concern  . None   Social History Narrative  . None    Family History: Family History  Problem Relation Age of Onset  . Heart disease Father   . Hypertension Father     Allergies: No Known Allergies  Prescriptions Prior to Admission  Medication Sig Dispense Refill Last Dose  . acetaminophen (TYLENOL) 500 MG tablet Take 1,000 mg by mouth every 6 (six) hours as needed for mild pain, moderate pain or headache.    Past Week at  Unknown time  . aspirin EC 81 MG tablet Take 81 mg by mouth at bedtime.   Past Month at Unknown time  . butalbital-acetaminophen-caffeine (FIORICET) 50-325-40 MG tablet Take 2 tablets by mouth every 6 (six) hours as needed for headache. 40 tablet 2 01/19/2016 at Unknown time  . NIFEdipine (PROCARDIA-XL/ADALAT-CC/NIFEDICAL-XL) 30 MG 24 hr tablet Take 30 mg by mouth at bedtime.   01/19/2016 at Unknown time  . Prenat w/o A-FeCbn-Meth-FA-DHA (PRENATE MINI) 29-0.6-0.4-350 MG CAPS Take 1 capsule by mouth at bedtime.   01/19/2016 at Unknown time  . ondansetron (ZOFRAN ODT) 8 MG disintegrating tablet Take 1 tablet (8 mg total) by mouth every 8 (eight) hours as needed for nausea. 30 tablet 2 More than a month at Unknown time     Review of Systems   All systems reviewed and negative except as stated in HPI  Blood pressure 124/77, pulse 111, temperature 98.6 F (37 C), temperature source Oral, resp. rate 17, height 5\' 5"  (1.651 m), weight 81.2 kg (179 lb), last menstrual period 05/27/2015, unknown if currently breastfeeding. General appearance: alert, cooperative and no distress Lungs: no respiratory distress Heart: regular rate Abdomen: soft, non-tender, gravid abdomen Extremities: No calf swelling or tenderness Presentation: cephalic by nurse exam Fetal monitoring: baseline 130, moderate  variability, accelerations present no decelerations Uterine activity: every 10 minutes Dilation: 2 Effacement (%): 60 Station: -2 Exam by:: [email or web address],[email or web address]   Prenatal labs: ABO, Rh: A/Positive/-- (06/21 1545) Antibody: Negative (06/21 1545) Rubella: immune RPR: Non Reactive (08/09 1755)  HBsAg: Negative (06/21 1545)  HIV: Non Reactive (08/09 1755)  GBS: Negative (09/21 1555)  Prenatal Transfer Tool  Maternal Diabetes: No Genetic Screening: Declined Maternal Ultrasounds/Referrals: Normal Fetal Ultrasounds or other Referrals:  Referred to Materal Fetal Medicine  Maternal Substance Abuse:  No Significant  Maternal Medications:  Meds include: Other:  procardia 30mg  Significant Maternal Lab Results: None  Results for orders placed or performed during the hospital encounter of 01/20/16 (from the past 24 hour(s))  CBC   Collection Time: 01/20/16  9:45 AM  Result Value Ref Range   WBC 9.1 4.0 - 10.5 K/uL   RBC 3.78 (L) 3.87 - 5.11 MIL/uL   Hemoglobin 10.7 (L) 12.0 - 15.0 g/dL   HCT 16.1 (L) 09.6 - 04.5 %   MCV 85.7 78.0 - 100.0 fL   MCH 28.3 26.0 - 34.0 pg   MCHC 33.0 30.0 - 36.0 g/dL   RDW 40.9 81.1 - 91.4 %   Platelets 204 150 - 400 K/uL  Comprehensive metabolic panel   Collection Time: 01/20/16  9:45 AM  Result Value Ref Range   Sodium 133 (L) 135 - 145 mmol/L   Potassium 3.8 3.5 - 5.1 mmol/L   Chloride 103 101 - 111 mmol/L   CO2 23 22 - 32 mmol/L   Glucose, Bld 100 (H) 65 - 99 mg/dL   BUN 9 6 - 20 mg/dL   Creatinine, Ser 7.82 0.44 - 1.00 mg/dL   Calcium 8.5 (L) 8.9 - 10.3 mg/dL   Total Protein 6.1 (L) 6.5 - 8.1 g/dL   Albumin 2.7 (L) 3.5 - 5.0 g/dL   AST 26 15 - 41 U/L   ALT 16 14 - 54 U/L   Alkaline Phosphatase 180 (H) 38 - 126 U/L   Total Bilirubin 0.7 0.3 - 1.2 mg/dL   GFR calc non Af Amer >60 >60 mL/min   GFR calc Af Amer >60 >60 mL/min   Anion gap 7 5 - 15    Patient Active Problem List   Diagnosis Date Noted  . Encounter for trial of labor 01/20/2016  . Supervision of high-risk pregnancy 07/11/2012  . Late prenatal care complicating pregnancy 07/11/2012    Assessment: Kara Conner is a 35 y.o. G4P3003 at [redacted]w[redacted]d here for IOL for gestational hypertension  #Labor: IOL #Pain: Desires epidural #FWB: Category 1 tracing #ID:  GBS negative #MOF: both #MOC:nexplanon #Circ:  Yes, outpatient  Tillman Sers, DO PGY-1 10/12/201710:34 AM   I have seen and evaluated this patient and agree with the above documentation and findings. Luna Kitchens CNM

## 2016-01-21 ENCOUNTER — Encounter (HOSPITAL_COMMUNITY): Payer: Self-pay

## 2016-01-21 ENCOUNTER — Encounter: Payer: Medicaid Other | Admitting: Obstetrics

## 2016-01-21 DIAGNOSIS — Z3A39 39 weeks gestation of pregnancy: Secondary | ICD-10-CM

## 2016-01-21 DIAGNOSIS — O4202 Full-term premature rupture of membranes, onset of labor within 24 hours of rupture: Secondary | ICD-10-CM

## 2016-01-21 DIAGNOSIS — O134 Gestational [pregnancy-induced] hypertension without significant proteinuria, complicating childbirth: Secondary | ICD-10-CM

## 2016-01-21 LAB — RPR: RPR: NONREACTIVE

## 2016-01-21 MED ORDER — METHYLERGONOVINE MALEATE 0.2 MG PO TABS: 0.2000 mg | ORAL_TABLET | ORAL | Status: DC | PRN

## 2016-01-21 MED ORDER — IBUPROFEN 600 MG PO TABS
600.0000 mg | ORAL_TABLET | Freq: Four times a day (QID) | ORAL | Status: DC
  Administered 2016-01-21 – 2016-01-22 (×5): 600 mg via ORAL
  Filled 2016-01-21 (×6): qty 1

## 2016-01-21 MED ORDER — OXYCODONE HCL 5 MG PO TABS
5.0000 mg | ORAL_TABLET | ORAL | Status: DC | PRN
  Administered 2016-01-21 (×2): 5 mg via ORAL
  Filled 2016-01-21 (×2): qty 1

## 2016-01-21 MED ORDER — WITCH HAZEL-GLYCERIN EX PADS: 1.0000 "application " | MEDICATED_PAD | CUTANEOUS | Status: DC | PRN

## 2016-01-21 MED ORDER — TETANUS-DIPHTH-ACELL PERTUSSIS 5-2.5-18.5 LF-MCG/0.5 IM SUSP
0.5000 mL | Freq: Once | INTRAMUSCULAR | Status: AC
  Administered 2016-01-22: 0.5 mL via INTRAMUSCULAR

## 2016-01-21 MED ORDER — FLEET ENEMA 7-19 GM/118ML RE ENEM: 1.0000 | ENEMA | Freq: Every day | RECTAL | Status: DC | PRN

## 2016-01-21 MED ORDER — METHYLERGONOVINE MALEATE 0.2 MG/ML IJ SOLN: 0.2000 mg | INTRAMUSCULAR | Status: DC | PRN

## 2016-01-21 MED ORDER — BISACODYL 10 MG RE SUPP: 10.0000 mg | Freq: Every day | RECTAL | Status: DC | PRN

## 2016-01-21 MED ORDER — ACETAMINOPHEN 325 MG PO TABS
650.0000 mg | ORAL_TABLET | ORAL | Status: DC | PRN
  Administered 2016-01-21: 650 mg via ORAL
  Filled 2016-01-21: qty 2

## 2016-01-21 MED ORDER — FERROUS SULFATE 325 (65 FE) MG PO TABS
325.0000 mg | ORAL_TABLET | Freq: Two times a day (BID) | ORAL | Status: DC
  Administered 2016-01-21 – 2016-01-22 (×3): 325 mg via ORAL
  Filled 2016-01-21 (×3): qty 1

## 2016-01-21 MED ORDER — ONDANSETRON HCL 4 MG PO TABS: 4.0000 mg | ORAL_TABLET | ORAL | Status: DC | PRN

## 2016-01-21 MED ORDER — SIMETHICONE 80 MG PO CHEW: 80.0000 mg | CHEWABLE_TABLET | ORAL | Status: DC | PRN

## 2016-01-21 MED ORDER — DIPHENHYDRAMINE HCL 25 MG PO CAPS: 25.0000 mg | ORAL_CAPSULE | Freq: Four times a day (QID) | ORAL | Status: DC | PRN

## 2016-01-21 MED ORDER — COCONUT OIL OIL: 1.0000 "application " | TOPICAL_OIL | Status: DC | PRN

## 2016-01-21 MED ORDER — BENZOCAINE-MENTHOL 20-0.5 % EX AERO: 1.0000 "application " | INHALATION_SPRAY | CUTANEOUS | Status: DC | PRN

## 2016-01-21 MED ORDER — OXYCODONE HCL 5 MG PO TABS: 10.0000 mg | ORAL_TABLET | ORAL | Status: DC | PRN

## 2016-01-21 MED ORDER — DOCUSATE SODIUM 100 MG PO CAPS
100.0000 mg | ORAL_CAPSULE | Freq: Two times a day (BID) | ORAL | Status: DC
  Administered 2016-01-21 – 2016-01-22 (×2): 100 mg via ORAL
  Filled 2016-01-21 (×2): qty 1

## 2016-01-21 MED ORDER — DIBUCAINE 1 % RE OINT: 1.0000 "application " | TOPICAL_OINTMENT | RECTAL | Status: DC | PRN

## 2016-01-21 MED ORDER — PRENATAL MULTIVITAMIN CH
1.0000 | ORAL_TABLET | Freq: Every day | ORAL | Status: DC
  Administered 2016-01-21 – 2016-01-22 (×2): 1 via ORAL
  Filled 2016-01-21 (×2): qty 1

## 2016-01-21 MED ORDER — ZOLPIDEM TARTRATE 5 MG PO TABS: 5.0000 mg | ORAL_TABLET | Freq: Every evening | ORAL | Status: DC | PRN

## 2016-01-21 MED ORDER — MEASLES, MUMPS & RUBELLA VAC ~~LOC~~ INJ
0.5000 mL | INJECTION | Freq: Once | SUBCUTANEOUS | Status: DC
  Filled 2016-01-21: qty 0.5

## 2016-01-21 MED ORDER — ONDANSETRON HCL 4 MG/2ML IJ SOLN
4.0000 mg | INTRAMUSCULAR | Status: DC | PRN
  Administered 2016-01-21: 4 mg via INTRAVENOUS
  Filled 2016-01-21: qty 2

## 2016-01-21 NOTE — Anesthesia Postprocedure Evaluation (Signed)
Anesthesia Post Note  Patient: Kara Conner  Procedure(s) Performed: * No procedures listed *  Patient location during evaluation: Mother Baby Anesthesia Type: Epidural Level of consciousness: awake and alert Pain management: pain level controlled Vital Signs Assessment: post-procedure vital signs reviewed and stable Respiratory status: spontaneous breathing, nonlabored ventilation and respiratory function stable Cardiovascular status: stable Postop Assessment: no headache, no backache, epidural receding and patient able to bend at knees Anesthetic complications: no     Last Vitals:  Vitals:   01/21/16 0200 01/21/16 0253  BP: 127/68 123/82  Pulse: 66 70  Resp: 18 16  Temp: 36.8 C 36.8 C    Last Pain:  Vitals:   01/21/16 0710  TempSrc:   PainSc: 0-No pain   Pain Goal: Patients Stated Pain Goal: 6 (01/20/16 1552)               Rica RecordsICKELTON,Hill Mackie

## 2016-01-22 MED ORDER — DOCUSATE SODIUM 100 MG PO CAPS: 100.0000 mg | ORAL_CAPSULE | Freq: Two times a day (BID) | ORAL | 0 refills | Status: DC

## 2016-01-22 MED ORDER — IBUPROFEN 600 MG PO TABS: 600.0000 mg | ORAL_TABLET | Freq: Four times a day (QID) | ORAL | 0 refills | Status: DC

## 2016-01-22 MED ORDER — INFLUENZA VAC SPLIT QUAD 0.5 ML IM SUSY
0.5000 mL | PREFILLED_SYRINGE | INTRAMUSCULAR | Status: AC
  Administered 2016-01-22: 0.5 mL via INTRAMUSCULAR

## 2016-01-22 MED ORDER — FERROUS SULFATE 325 (65 FE) MG PO TABS: 325.0000 mg | ORAL_TABLET | Freq: Every day | ORAL | 1 refills | Status: DC

## 2016-01-22 NOTE — Discharge Instructions (Signed)
Postpartum Care After Vaginal Delivery °After you deliver your newborn (postpartum period), the usual stay in the hospital is 24-72 hours. If there were problems with your labor or delivery, or if you have other medical problems, you might be in the hospital longer.  °While you are in the hospital, you will receive help and instructions on how to care for yourself and your newborn during the postpartum period.  °While you are in the hospital: °· Be sure to tell your nurses if you have pain or discomfort, as well as where you feel the pain and what makes the pain worse. °· If you had an incision made near your vagina (episiotomy) or if you had some tearing during delivery, the nurses may put ice packs on your episiotomy or tear. The ice packs may help to reduce the pain and swelling. °· If you are breastfeeding, you may feel uncomfortable contractions of your uterus for a couple of weeks. This is normal. The contractions help your uterus get back to normal size. °· It is normal to have some bleeding after delivery. °¨ For the first 1-3 days after delivery, the flow is red and the amount may be similar to a period. °¨ It is common for the flow to start and stop. °¨ In the first few days, you may pass some small clots. Let your nurses know if you begin to pass large clots or your flow increases. °¨ Do not  flush blood clots down the toilet before having the nurse look at them. °¨ During the next 3-10 days after delivery, your flow should become more watery and pink or brown-tinged in color. °¨ Ten to fourteen days after delivery, your flow should be a small amount of yellowish-white discharge. °¨ The amount of your flow will decrease over the first few weeks after delivery. Your flow may stop in 6-8 weeks. Most women have had their flow stop by 12 weeks after delivery. °· You should change your sanitary pads frequently. °· Wash your hands thoroughly with soap and water for at least 20 seconds after changing pads, using  the toilet, or before holding or feeding your newborn. °· You should feel like you need to empty your bladder within the first 6-8 hours after delivery. °· In case you become weak, lightheaded, or faint, call your nurse before you get out of bed for the first time and before you take a shower for the first time. °· Within the first few days after delivery, your breasts may begin to feel tender and full. This is called engorgement. Breast tenderness usually goes away within 48-72 hours after engorgement occurs. You may also notice milk leaking from your breasts. If you are not breastfeeding, do not stimulate your breasts. Breast stimulation can make your breasts produce more milk. °· Spending as much time as possible with your newborn is very important. During this time, you and your newborn can feel close and get to know each other. Having your newborn stay in your room (rooming in) will help to strengthen the bond with your newborn.  It will give you time to get to know your newborn and become comfortable caring for your newborn. °· Your hormones change after delivery. Sometimes the hormone changes can temporarily cause you to feel sad or tearful. These feelings should not last more than a few days. If these feelings last longer than that, you should talk to your caregiver. °· If desired, talk to your caregiver about methods of family planning or contraception. °·   Talk to your caregiver about immunizations. Your caregiver may want you to have the following immunizations before leaving the hospital:  Tetanus, diphtheria, and pertussis (Tdap) or tetanus and diphtheria (Td) immunization. It is very important that you and your family (including grandparents) or others caring for your newborn are up-to-date with the Tdap or Td immunizations. The Tdap or Td immunization can help protect your newborn from getting ill.  Rubella immunization.  Varicella (chickenpox) immunization.  Influenza immunization. You should  receive this annual immunization if you did not receive the immunization during your pregnancy.   This information is not intended to replace advice given to you by your health care provider. Make sure you discuss any questions you have with your health care provider.   Document Released: 01/22/2007 Document Revised: 12/20/2011 Document Reviewed: 11/22/2011 Elsevier Interactive Patient Education 2016 [employer]   Storing Breast Milk Breast milk is a living fluid that contains infection-fighting cells (antibodies). Pre-pumped (expressed) breast milk needs to be stored in a certain way so that it remains effective in protecting your baby against infections. The following guidelines are for storing breast milk for a healthy, full-term infant.  HOW LONG CAN BREAST MILK BE STORED?  Milk can be stored for up to 4 hours at room temperature, or 60-35F (15.6-19.4C). However, it is acceptable to allow the milk to sit for 6-8 hours if the pump parts and containers are well cleaned.  Milk can be stored for 3 days in a refrigerator at less than 20F (3.9C). However, it is acceptable to allow the milk to sit for up to 8 days if the pump parts and containers are well cleaned.  Milk can be stored for 2 weeks in a freezer compartment inside a refrigerator.  Milk can be stored for 3-4 months in a freezer unit with a separate door.  Milk can be stored for 6-12 months in a deep freezer at -50F (-20C). A deep freezer is a chest or stand-alone freezer that is not opened very often and stays at a colder temperature. HOW SHOULD I STORE BREAST MILK?  Milk may be stored in a:  Glass container.  Hard plastic container.  Plastic bag specially designed for storing milk. Many women like these because they take up less space and can be attached directly to the breast pump.  Store your milk in 2-4 oz (60-120 mL) servings. This makes it easier to thaw the milk. It also helps you avoid having to throw out milk  your baby does not drink.  Leave an inch or so at the top of the bag or bottle so the milk has room to expand as it freezes.  Label each container with the date and time the milk was pumped so that you use the milk in the order it was pumped.  If you will be freezing the milk, store it in the back of the freezer. This prevents the milk from being affected by temperature changes due to the freezer door being opened. THAWING FROZEN BREAST MILK  Frozen milk can be thawed:  In a refrigerator.  Under warm-running tap water.  In a pan of warm water that has been heated on the stove.  Do not heat milk directly on the stove or in a microwave as this will destroy some of its infection-fighting properties.  Thawed milk can be stored in the refrigerator for up to 24 hours but should not be refrozen.  If you want to add freshly pumped milk to the frozen  milk, make sure to add less milk than what is already frozen. Chill the fresh milk in your refrigerator for 30 minutes before adding it to the milk in the freezer. °  °This information is not intended to replace advice given to you by your health care provider. Make sure you discuss any questions you have with your health care provider. °  °Document Released: 01/22/2009 Document Revised: 04/01/2013 Document Reviewed: 01/06/2013 °Elsevier Interactive Patient Education ©2016 Elsevier Inc. ° °

## 2016-01-22 NOTE — Lactation Note (Signed)
This note was copied from a baby's chart. Lactation Consultation Note  Patient Name: Kara Conner WUJWJ'XToday's Date: 01/22/2016 Reason for consult: Initial assessment Mom has been giving bottles. LC inquired with Mom if she would like help with latch but she was not interested. She did have questions about using a nipple shield and LC advised that she would have to be fitted and see a feeding to give Mom a nipple shield. Mom reports she had a lot of pain trying to BF her older children so was not sure about BF this baby. LC advised Mom if she would like help with latch to call and schedule OP f/u since she is getting ready to go home. If she has any idea that she wants to pump/bottle, she needs to start pumping every 3 hours for 15 minutes. Mom reports she is getting pump from insurance company. Harmony hand pump given reviewed cleaning and how to use. Questions answered. Mom to call is she would like any further assist.   Maternal Data Has patient been taught Hand Expression?: Yes Does the patient have breastfeeding experience prior to this delivery?: No  Feeding Feeding Type: Bottle Fed - Formula Nipple Type: Slow - flow  LATCH Score/Interventions                      Lactation Tools Discussed/Used Tools: Pump Breast pump type: Double-Electric Breast Pump   Consult Status Consult Status: Complete    Alfred LevinsGranger, Bowie Doiron Ann 01/22/2016, 2:15 PM

## 2016-01-22 NOTE — Discharge Summary (Signed)
OB Discharge Summary     Patient Name: Kara Conner DOB: Mar 29, 1981 MRN: [id number]  Date of admission: 01/20/2016 Delivering MD: Jacklyn Shell   Date of discharge: 01/22/2016  Admitting diagnosis: INDUCTION Intrauterine pregnancy: [redacted]w[redacted]d     Secondary diagnosis:  Active Problems:   Encounter for trial of labor   Spontaneous vaginal delivery  Additional problems: Gestational hypertension, controlled     Discharge diagnosis: Term Pregnancy Delivered and Gestational Hypertension                                                                                                Post partum procedures:None  Augmentation: Pitocin and Cytotec  Complications: None  Hospital course:  Induction of Labor With Vaginal Delivery   35 y.o. yo [id number] at [redacted]w[redacted]d was admitted to the hospital 01/20/2016 for induction of labor.  Indication for induction: Gestational hypertension.  Patient had an uncomplicated labor course as follows: Membrane Rupture Time/Date: 12:00 AM ,01/21/2016   Intrapartum Procedures: Episiotomy: None [1]                                         Lacerations:  1st degree [2]  Patient had delivery of a Viable infant.  Information for the patient's newborn:  Didi, Ganaway [[id number]]  Delivery Method: Vaginal, Spontaneous Delivery (Filed from Delivery Summary)   01/21/2016  Details of delivery can be found in separate delivery note.  Patient had a routine postpartum course. Patient is discharged home 01/22/16.   Physical exam Vitals:   01/21/16 0200 01/21/16 0253 01/21/16 1840 01/22/16 0527  BP: 127/68 123/82 123/74 125/79  Pulse: 66 70 72 79  Resp: 18 16 18 18   Temp: 98.2 F (36.8 C) 98.2 F (36.8 C) 98 F (36.7 C) 98.2 F (36.8 C)  TempSrc: Oral Oral Oral Oral  SpO2:      Weight:      Height:       General: alert, cooperative and no distress Lochia: appropriate Uterine Fundus: firm Incision: N/A DVT Evaluation: No evidence of DVT seen on  physical exam. Negative Homan's sign. No cords or calf tenderness. Labs: Lab Results  Component Value Date   WBC 8.4 01/20/2016   HGB 9.9 (L) 01/20/2016   HCT 29.9 (L) 01/20/2016   MCV 84.9 01/20/2016   PLT 191 01/20/2016   CMP Latest Ref Rng & Units 01/20/2016  Glucose 65 - 99 mg/dL 130(Q)  BUN 6 - 20 mg/dL 9  Creatinine 6.57 - 8.46 mg/dL 9.62  Sodium 952 - 841 mmol/L 133(L)  Potassium 3.5 - 5.1 mmol/L 3.8  Chloride 101 - 111 mmol/L 103  CO2 22 - 32 mmol/L 23  Calcium 8.9 - 10.3 mg/dL 3.2(G)  Total Protein 6.5 - 8.1 g/dL 6.1(L)  Total Bilirubin 0.3 - 1.2 mg/dL 0.7  Alkaline Phos 38 - 126 U/L 180(H)  AST 15 - 41 U/L 26  ALT 14 - 54 U/L 16    Discharge instruction: per After Visit Summary and "Baby and Me Booklet".  After visit meds:    Medication List    STOP taking these medications   acetaminophen 500 MG tablet Commonly known as:  TYLENOL   aspirin EC 81 MG tablet   butalbital-acetaminophen-caffeine 50-325-40 MG tablet Commonly known as:  FIORICET   NIFEdipine 30 MG 24 hr tablet Commonly known as:  PROCARDIA-XL/ADALAT-CC/NIFEDICAL-XL   ondansetron 8 MG disintegrating tablet Commonly known as:  ZOFRAN ODT   PRENATE MINI 29-0.6-0.4-350 MG Caps     TAKE these medications   docusate sodium 100 MG capsule Commonly known as:  COLACE Take 1 capsule (100 mg total) by mouth 2 (two) times daily.   ferrous sulfate 325 (65 FE) MG tablet Take 1 tablet (325 mg total) by mouth daily with breakfast.   ibuprofen 600 MG tablet Commonly known as:  ADVIL,MOTRIN Take 1 tablet (600 mg total) by mouth every 6 (six) hours.       Diet: low salt diet  Activity: Advance as tolerated. Pelvic rest for 6 weeks.   Outpatient follow up:2 weeks Follow up Appt:No future appointments. Follow up Visit:No Follow-up on file.  Postpartum contraception: Nexplanon  Newborn Data: Live born female  Birth Weight: 7 lb 2.8 oz (3255 g) APGAR: 9, 9  Baby Feeding: Bottle and  Breast Disposition:home with mother   01/22/2016 Jen MowElizabeth Malicia Blasdel, DO

## 2016-03-01 ENCOUNTER — Ambulatory Visit (INDEPENDENT_AMBULATORY_CARE_PROVIDER_SITE_OTHER): Payer: Medicaid Other | Admitting: Obstetrics and Gynecology

## 2016-03-01 MED ORDER — CLONAZEPAM 0.5 MG PO TABS: 0.5000 mg | ORAL_TABLET | Freq: Two times a day (BID) | ORAL | 0 refills | Status: DC | PRN

## 2016-03-01 NOTE — Progress Notes (Signed)
Subjective:     Kara Conner is a 35 y.o. female G4P4 who presents for a postpartum visit. She is 5 weeks postpartum following a spontaneous vaginal delivery. I have fully reviewed the prenatal and intrapartum course. The delivery was at 39 gestational weeks with gestational hypertension. Outcome: spontaneous vaginal delivery. Anesthesia: epidural. Postpartum course has been uncomplicated. Baby's course has been uncomplicated. Baby is feeding by bottle - gerber soy. Bleeding no bleeding. Bowel function is normal. Bladder function is normal. Patient is not sexually active. Contraception method is abstinence. Postpartum depression screening: negative.     Review of Systems Pertinent items are noted in HPI.   Objective:    BP 118/79   Pulse 64   Ht 5\' 5"  (1.651 m)   Wt 167 lb 6.4 oz (75.9 kg)   BMI 27.86 kg/m   General:  alert, cooperative and no distress   Breasts:  inspection negative, no nipple discharge or bleeding, no masses or nodularity palpable  Lungs: clear to auscultation bilaterally  Heart:  regular rate and rhythm  Abdomen: soft, non-tender; bowel sounds normal; no masses,  no organomegaly   Vulva:  normal  Vagina: normal vagina, no discharge, exudate, lesion, or erythema  Cervix:  multiparous appearance  Corpus: normal size, contour, position, consistency, mobility, non-tender  Adnexa:  normal adnexa and no mass, fullness, tenderness  Rectal Exam: Not performed.        Assessment:     Normal postpartum exam. Pap smear not done at today's visit.   Plan:    1. Contraception: Patient desires Nexplanon. She will need to return to have it inserted in a few weeks. Patient instructed to use condoms in the meantime 2. Patient is medically cleared to resume all normal activities of daily living 3. Follow up in: 3 weeks for Nexplanon insertion or as needed.

## 2016-03-01 NOTE — Progress Notes (Deleted)
..  Post Partum Exam  Kara Conner is a 35 y.o. [phone] female who presents for a postpartum visit. She is 6 weeks postpartum following a spontaneous vaginal delivery. I have fully reviewed the prenatal and intrapartum course. The delivery was at 39 gestational weeks.  Anesthesia: epidural. Postpartum course has been ***. Baby's course has been ***. Baby is feeding by bottle - Gerber Soy. Bleeding no bleeding. Bowel function is normal. Bladder function is normal. Patient is not sexually active. Contraception method is none. Postpartum depression screening:neg  {Common ambulatory SmartLinks:19316}  Review of Systems {ros; complete:30496}    Objective:    BP 116/78 mmHg  Pulse 78  Resp 16  Ht 5\' 5"  (1.651 m)  Wt 211 lb (95.709 kg)  BMI 35.11 kg/m2  Breastfeeding? Yes  General:  {gen appearance:16600}   Breasts:  {breast exam:1202::"inspection negative, no nipple discharge or bleeding, no masses or nodularity palpable"}  Lungs: {lung exam:16931}  Heart:  {heart exam:5510}  Abdomen: {abdomen exam:16834}   Vulva:  {labia exam:12198}  Vagina: {vagina exam:12200}  Cervix:  {cervix exam:14595}  Corpus: {uterus exam:12215}  Adnexa:  {adnexa exam:12223}  Rectal Exam: {rectal/vaginal exam:12274}        Assessment:    *** postpartum exam. Pap smear {done:10129} at today's visit.   Plan:   1. Contraception: {method:5051} 2. *** 3. Follow up in: {1-10:13787} {time; units:19136} or as needed.

## 2016-03-22 ENCOUNTER — Ambulatory Visit (INDEPENDENT_AMBULATORY_CARE_PROVIDER_SITE_OTHER): Payer: Medicaid Other | Admitting: Obstetrics & Gynecology

## 2016-03-22 VITALS — BP 122/87 | HR 71 | Wt 165.4 lb

## 2016-03-22 DIAGNOSIS — Z01812 Encounter for preprocedural laboratory examination: Secondary | ICD-10-CM

## 2016-03-22 DIAGNOSIS — Z3202 Encounter for pregnancy test, result negative: Secondary | ICD-10-CM | POA: Diagnosis not present

## 2016-03-22 LAB — POCT URINE PREGNANCY: PREG TEST UR: NEGATIVE

## 2016-03-22 NOTE — Progress Notes (Signed)
Patient was sexually active 3-4 days ago using withdrawal method. Patient delivered 10/13. Per provider- UPT today- reschedule insertion in 2 weeks or with cycle.

## 2016-03-30 ENCOUNTER — Other Ambulatory Visit: Payer: Self-pay | Admitting: Obstetrics and Gynecology

## 2016-03-31 ENCOUNTER — Other Ambulatory Visit: Payer: Self-pay | Admitting: Obstetrics and Gynecology

## 2016-03-31 NOTE — Telephone Encounter (Signed)
You may call in a refill. No further refills will be provided from this office. She needs to establish care with a PCP. Please provide PCP options

## 2016-04-01 ENCOUNTER — Other Ambulatory Visit: Payer: Self-pay | Admitting: Obstetrics and Gynecology

## 2016-04-13 ENCOUNTER — Encounter: Payer: Self-pay | Admitting: Family Medicine

## 2016-04-13 ENCOUNTER — Ambulatory Visit (INDEPENDENT_AMBULATORY_CARE_PROVIDER_SITE_OTHER): Payer: Medicaid Other | Admitting: Family Medicine

## 2016-04-13 VITALS — BP 115/80 | HR 64 | Temp 97.8°F | Wt 163.0 lb

## 2016-04-13 DIAGNOSIS — F419 Anxiety disorder, unspecified: Secondary | ICD-10-CM

## 2016-04-13 DIAGNOSIS — Z30017 Encounter for initial prescription of implantable subdermal contraceptive: Secondary | ICD-10-CM

## 2016-04-13 DIAGNOSIS — Z3049 Encounter for surveillance of other contraceptives: Secondary | ICD-10-CM | POA: Diagnosis not present

## 2016-04-13 DIAGNOSIS — Z30019 Encounter for initial prescription of contraceptives, unspecified: Secondary | ICD-10-CM

## 2016-04-13 DIAGNOSIS — Z3202 Encounter for pregnancy test, result negative: Secondary | ICD-10-CM | POA: Diagnosis not present

## 2016-04-13 LAB — POCT URINE PREGNANCY: PREG TEST UR: NEGATIVE

## 2016-04-13 MED ORDER — ETONOGESTREL 68 MG ~~LOC~~ IMPL
68.0000 mg | DRUG_IMPLANT | Freq: Once | SUBCUTANEOUS | Status: AC
  Administered 2016-04-13: 68 mg via SUBCUTANEOUS

## 2016-04-13 MED ORDER — CLONAZEPAM 0.5 MG PO TABS: 0.5000 mg | ORAL_TABLET | Freq: Two times a day (BID) | ORAL | 0 refills | Status: AC | PRN

## 2016-04-13 NOTE — Progress Notes (Signed)
Patient is in the office for nexplanon insertion. 

## 2016-04-13 NOTE — Progress Notes (Signed)
Nexplanon Insertion:  Patient given informed consent, signed copy in the chart, time out was performed. Pregnancy test was negative. Appropriate time out taken.  Patient's left arm was prepped and draped in the usual sterile fashion.. The ruler used to measure and mark insertion area.  Pt was prepped with alcohol swab and then injected with 3 cc of 1% lidocaine with epinephrine.  Pt was prepped with betadine, Implanon removed form packaging,  Device confirmed in needle, then inserted full length of needle and withdrawn per handbook instructions.  Device palpated by physician and patient.  Pt insertion site covered with pressure dressing.   Minimal blood loss.  Pt tolerated the procedure well.   

## 2016-04-13 NOTE — Patient Instructions (Signed)
Contraceptive Implant Information A contraceptive implant is a plastic rod that is inserted under your skin. It is usually inserted under the skin of your upper arm. It continually releases small amounts of progestin (synthetic progesterone) into your bloodstream. This prevents an egg from being released from your ovaries. It also thickens your cervical mucus to prevent sperm from entering the cervix, and it thins your uterine lining to prevent a fertilized egg from attaching to your uterus. Contraceptive implants can be effective for up to 3 years. They do not provide protection against sexually transmitted diseases (STDs).  The procedure to insert an implant usually takes about 10 minutes. There may be minor bruising, swelling, and discomfort at the insertion site for a couple days. The implant begins to work within the first day. Other contraceptive protection may be necessary for 7 days. Be sure to discuss with your health care provider if you need a backup method of contraception.  Your health care provider will make sure you are a good candidate for the contraceptive implant. Discuss with your health care provider the possible side effects of the implant. ADVANTAGES  It prevents pregnancy for up to 3 years.  It is easily reversible.  It is convenient.  It can be used when breastfeeding.  It can be used by women who cannot take estrogen. DISADVANTAGES  You may have irregular or unplanned vaginal bleeding.  You may develop side effects, including headache, weight gain, acne, breast tenderness, or mood changes.  You may have tissue or nerve damage after insertion (rare).  It may be difficult and uncomfortable to remove.  Certain medicines may interfere with the effectiveness of the implant. REMOVAL OF IMPLANT The implant should be removed in 3 years or as directed by your health care provider. The implant's effect wears off in a few hours after removal. Your ability to get pregnant  (fertility) may be restored in 1-2 weeks. A new implant can be inserted as soon as the old one is removed if desired. CONTRAINDICATIONS You should not get the implant if you are experiencing any of the following situations:  You are pregnant.  You have a history of breast cancer, osteoporosis, blood clots, heart disease, diabetes, high blood pressure, liver disease, tumors, or stroke.   You have undiagnosed vaginal bleeding.  You have a sensitivity to any part of the implant. This information is not intended to replace advice given to you by your health care provider. Make sure you discuss any questions you have with your health care provider. Document Released: 03/16/2011 Document Revised: 11/27/2012 Document Reviewed: 09/23/2012 Elsevier Interactive Patient Education  2017 Elsevier Inc.  

## 2016-04-13 NOTE — Addendum Note (Signed)
Addended by: Natale MilchSTALLING, Rohn Fritsch D on: 04/13/2016 03:33 PM   Modules accepted: Orders

## 2016-06-25 ENCOUNTER — Other Ambulatory Visit: Payer: Self-pay | Admitting: Obstetrics

## 2017-08-14 ENCOUNTER — Ambulatory Visit: Payer: Self-pay | Admitting: Obstetrics & Gynecology

## 2017-08-14 ENCOUNTER — Ambulatory Visit: Payer: Medicaid Other | Admitting: Obstetrics

## 2017-08-14 ENCOUNTER — Encounter: Payer: Self-pay | Admitting: Obstetrics

## 2017-08-14 ENCOUNTER — Other Ambulatory Visit (HOSPITAL_COMMUNITY)
Admission: RE | Admit: 2017-08-14 | Discharge: 2017-08-14 | Disposition: A | Discharge status: Home / Self Care | Payer: Medicaid Other | Source: Ambulatory Visit | Attending: Obstetrics | Admitting: Obstetrics

## 2017-08-14 VITALS — BP 138/100 | HR 77 | Wt 125.0 lb

## 2017-08-14 DIAGNOSIS — Z124 Encounter for screening for malignant neoplasm of cervix: Secondary | ICD-10-CM | POA: Diagnosis present

## 2017-08-14 DIAGNOSIS — Z01419 Encounter for gynecological examination (general) (routine) without abnormal findings: Secondary | ICD-10-CM

## 2017-08-14 DIAGNOSIS — G43719 Chronic migraine without aura, intractable, without status migrainosus: Secondary | ICD-10-CM

## 2017-08-14 DIAGNOSIS — Z0000 Encounter for general adult medical examination without abnormal findings: Secondary | ICD-10-CM

## 2017-08-14 DIAGNOSIS — N762 Acute vulvitis: Secondary | ICD-10-CM

## 2017-08-14 DIAGNOSIS — B3731 Acute candidiasis of vulva and vagina: Secondary | ICD-10-CM

## 2017-08-14 DIAGNOSIS — B373 Candidiasis of vulva and vagina: Secondary | ICD-10-CM

## 2017-08-14 MED ORDER — IBUPROFEN 800 MG PO TABS: 800.0000 mg | ORAL_TABLET | Freq: Three times a day (TID) | ORAL | 5 refills | Status: AC | PRN

## 2017-08-14 MED ORDER — FLUCONAZOLE 200 MG PO TABS: 200.0000 mg | ORAL_TABLET | ORAL | 1 refills | Status: DC

## 2017-08-14 MED ORDER — CLINDAMYCIN PHOSPHATE 1 % EX SOLN: Freq: Two times a day (BID) | CUTANEOUS | 1 refills | Status: AC

## 2017-08-14 MED ORDER — HYDROCORTISONE 2.5 % EX OINT: TOPICAL_OINTMENT | Freq: Two times a day (BID) | CUTANEOUS | 0 refills | Status: DC

## 2017-08-14 MED ORDER — RIZATRIPTAN BENZOATE 10 MG PO TABS: 10.0000 mg | ORAL_TABLET | ORAL | 11 refills | Status: DC | PRN

## 2017-08-14 NOTE — Progress Notes (Signed)
RGYN patient present for problem visit today not Annual  Pap: 09/29/2015  c/o: possible razor bumps that itch pt describes them as feeling as "paper cuts"   Pt states they only bother her in the shower   Denies any burning or discharge for bumps.

## 2017-08-14 NOTE — Progress Notes (Signed)
Subjective:        Kara Conner is a 37 y.o. female here for a routine exam.  Current complaints: Labial irritation from shaving..    Personal health questionnaire:  Is patient Ashkenazi Jewish, have a family history of breast and/or ovarian cancer: no Is there a family history of uterine cancer diagnosed at age < 34, gastrointestinal cancer, urinary tract cancer, family member who is a Personnel officer syndrome-associated carrier: no Is the patient overweight and hypertensive, family history of diabetes, personal history of gestational diabetes, preeclampsia or PCOS: no Is patient over 26, have PCOS,  family history of premature CHD under age 78, diabetes, smoke, have hypertension or peripheral artery disease:  no At any time, has a partner hit, kicked or otherwise hurt or frightened you?: no Over the past 2 weeks, have you felt down, depressed or hopeless?: no Over the past 2 weeks, have you felt little interest or pleasure in doing things?:no   Gynecologic History No LMP recorded. Contraception: Nexplanon Last Pap: 2017. Results were: NILM with positive HPV Last mammogram: n/a. Results were: n/a  Obstetric History OB History  Gravida Para Term Preterm AB Living  SAB TAB Ectopic Multiple Live Births        0 4    # Outcome Date GA Lbr Len/2nd Weight Sex Delivery Anes PTL Lv  4 Term 01/21/16 [redacted]w[redacted]d 00:22 / 00:05 7 lb 2.8 oz (3.255 kg) M Vag-Spont EPI  LIV  3 Term 09/13/12 [redacted]w[redacted]d 08:04 / 00:12 6 lb 9.8 oz (2.999 kg) M Vag-Spont EPI  LIV  2 Term 02/02/06 [redacted]w[redacted]d  7 lb 7 oz (3.374 kg) M Vag-Spont EPI N LIV     Birth Comments: No prob  1 Term 04/27/04 [redacted]w[redacted]d  6 lb 5 oz (2.863 kg) M Vag-Spont EPI N LIV    Past Medical History:  Diagnosis Date  . Hypertension    1st pregnancy for preeclampsia    History reviewed. No pertinent surgical history.   Current Outpatient Medications:  .  clonazePAM (KLONOPIN) 0.5 MG tablet, Take 1 tablet (0.5 mg total) by mouth 2 (two) times daily  as needed for anxiety. (Patient taking differently: Take 0.5 mg by mouth 2 (two) times daily as needed for anxiety (  per pt). ), Disp: 30 tablet, Rfl: 0 .  clonazePAM (KLONOPIN) 1 MG tablet, Take 1 mg by mouth 2 (two) times daily., Disp: , Rfl: 5 .  etonogestrel (NEXPLANON) 68 MG IMPL implant, 68 mg by Subdermal route once., Disp: , Rfl:  .  oxyCODONE-acetaminophen (PERCOCET) 7.5-325 MG tablet, TK 1 T PO  Q 8 H, Disp: , Rfl: 0 .  traZODone (DESYREL) 50 MG tablet, Take 50 mg by mouth 2 (two) times daily., Disp: , Rfl: 2 .  butalbital-acetaminophen-caffeine (FIORICET, ESGIC) 50-325-40 MG tablet, Take by mouth 2 (two) times daily as needed for headache., Disp: , Rfl:  .  clindamycin (CLEOCIN-T) 1 % external solution, Apply topically 2 (two) times daily., Disp: 60 mL, Rfl: 1 .  fluconazole (DIFLUCAN) 200 MG tablet, Take 1 tablet (200 mg total) by mouth every 3 (three) days., Disp: 3 tablet, Rfl: 1 .  hydrocortisone 2.5 % ointment, Apply topically 2 (two) times daily., Disp: 30 g, Rfl: 0 .  ibuprofen (ADVIL,MOTRIN) 800 MG tablet, Take 1 tablet (800 mg total) by mouth every 8 (eight) hours as needed., Disp: 30 tablet, Rfl: 5 .  rizatriptan (MAXALT) 10 MG tablet, Take 1 tablet (10 mg  total) by mouth as needed for migraine. May repeat in 2 hours if needed, Disp: 10 tablet, Rfl: 11 No Known Allergies  Social History   Tobacco Use  . Smoking status: Former Smoker    Packs/day: 1.00    Years: 20.00    Pack years: 20.00    Types: Cigarettes    Last attempt to quit: 04/21/2005    Years since quitting: 12.3  . Smokeless tobacco: Never Used  Substance Use Topics  . Alcohol use: No    Alcohol/week: 0.0 oz    Family History  Problem Relation Age of Onset  . Heart disease Father   . Hypertension Father       Review of Systems  Constitutional: negative for fatigue and weight loss Respiratory: negative for cough and wheezing Cardiovascular: negative for chest pain, fatigue and  palpitations Gastrointestinal: negative for abdominal pain and change in bowel habits Musculoskeletal:negative for myalgias Neurological: POSITIVE FOR MIGRAINES Behavioral/Psych: negative for abusive relationship, depression Endocrine: negative for temperature intolerance    Genitourinary:negative for abnormal menstrual periods,  hot flashes, sexual problems and vaginal discharge.  POSITIVE for genital lesions Integument/breast: negative for breast lump, breast tenderness, nipple discharge. POSITIVE for skin lesion(s)    Objective:       BP (!) 138/100   Pulse 77   Wt 125 lb (56.7 kg)   BMI 20.80 kg/m  General:   alert  Skin:   no rash or abnormalities  Lungs:   clear to auscultation bilaterally  Heart:   regular rate and rhythm, S1, S2 normal, no murmur, click, rub or gallop  Breasts:   normal without suspicious masses, skin or nipple changes or axillary nodes  Abdomen:  normal findings: no organomegaly, soft, non-tender and no hernia  Pelvis:  External genitalia: EXCORIATIONS - LABIA MINORA, BILATERAL Urinary system: urethral meatus normal and bladder without fullness, nontender Vaginal: normal without tenderness, induration or masses Cervix: normal appearance Adnexa: normal bimanual exam Uterus: anteverted and non-tender, normal size   Lab Review Urine pregnancy test Labs reviewed yes Radiologic studies reviewed yes  50% of 20 min visit spent on counseling and coordination of care.   Assessment:     1. Encounter for routine gynecological examination with Papanicolaou smear of cervix Rx: - Cervicovaginal ancillary only  2. Screening for cervical cancer Rx: - Cytology - PAP  3. Candidal vulvovaginitis Rx: - fluconazole (DIFLUCAN) 200 MG tablet; Take 1 tablet (200 mg total) by mouth every 3 (three) days.  Dispense: 3 tablet; Refill: 1  4. Acute vulvitis Rx: - clindamycin (CLEOCIN-T) 1 % external solution; Apply topically 2 (two) times daily.  Dispense: 60 mL;  Refill: 1 - hydrocortisone 2.5 % ointment; Apply topically 2 (two) times daily.  Dispense: 30 g; Refill: 0  5. Intractable chronic migraine without aura and without status migrainosus Rx: - rizatriptan (MAXALT) 10 MG tablet; Take 1 tablet (10 mg total) by mouth as needed for migraine. May repeat in 2 hours if needed  Dispense: 10 tablet; Refill: 11 - ibuprofen (ADVIL,MOTRIN) 800 MG tablet; Take 1 tablet (800 mg total) by mouth every 8 (eight) hours as needed.  Dispense: 30 tablet; Refill: 5     Plan:    Education reviewed: calcium supplements, depression evaluation, low fat, low cholesterol diet, safe sex/STD prevention, self breast exams and weight bearing exercise. Contraception: Nexplanon. Follow up in: 1 year.   Meds ordered this encounter  Medications  . fluconazole (DIFLUCAN) 200 MG tablet    Sig: Take 1 tablet (  200 mg total) by mouth every 3 (three) days.    Dispense:  3 tablet    Refill:  1  . clindamycin (CLEOCIN-T) 1 % external solution    Sig: Apply topically 2 (two) times daily.    Dispense:  60 mL    Refill:  1  . hydrocortisone 2.5 % ointment    Sig: Apply topically 2 (two) times daily.    Dispense:  30 g    Refill:  0  . rizatriptan (MAXALT) 10 MG tablet    Sig: Take 1 tablet (10 mg total) by mouth as needed for migraine. May repeat in 2 hours if needed    Dispense:  10 tablet    Refill:  11  . ibuprofen (ADVIL,MOTRIN) 800 MG tablet    Sig: Take 1 tablet (800 mg total) by mouth every 8 (eight) hours as needed.    Dispense:  30 tablet    Refill:  5   No orders of the defined types were placed in this encounter.   Brock Bad MD 08-14-2017

## 2017-08-15 LAB — CERVICOVAGINAL ANCILLARY ONLY
Bacterial vaginitis: NEGATIVE
Candida vaginitis: NEGATIVE
Chlamydia: NEGATIVE
Neisseria Gonorrhea: NEGATIVE
Trichomonas: NEGATIVE

## 2017-08-17 LAB — CYTOLOGY - PAP
Diagnosis: NEGATIVE
HPV: NOT DETECTED

## 2018-08-02 IMAGING — US US MFM FETAL BPP W/O NON-STRESS
1 series · 13 of 28 positions shown · non-contrast
Comparison: none

[Series 1: us mfm fetal bpp w/o non-stress · 57 acquisitions, 13 frames shown]
[im 3/57]
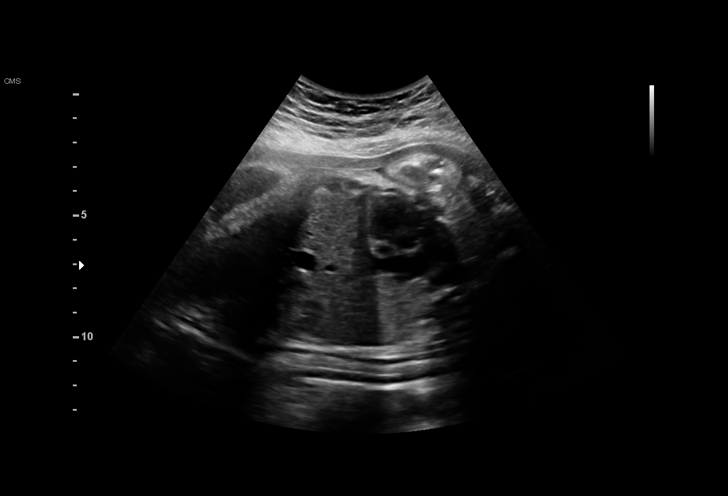
[im 7/57]
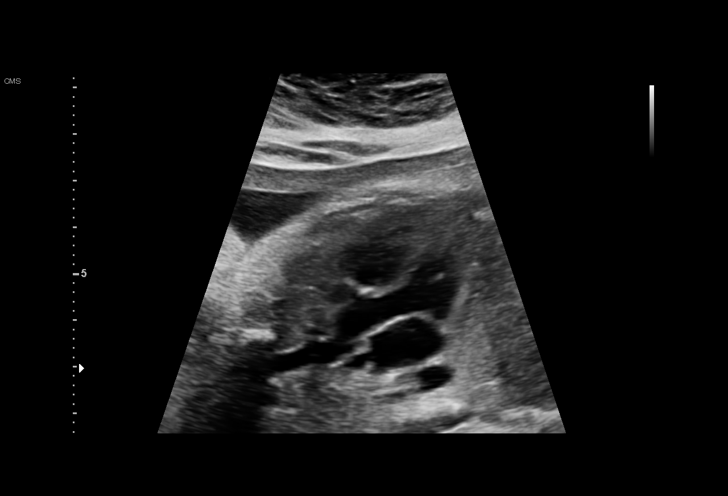
[im 11/57]
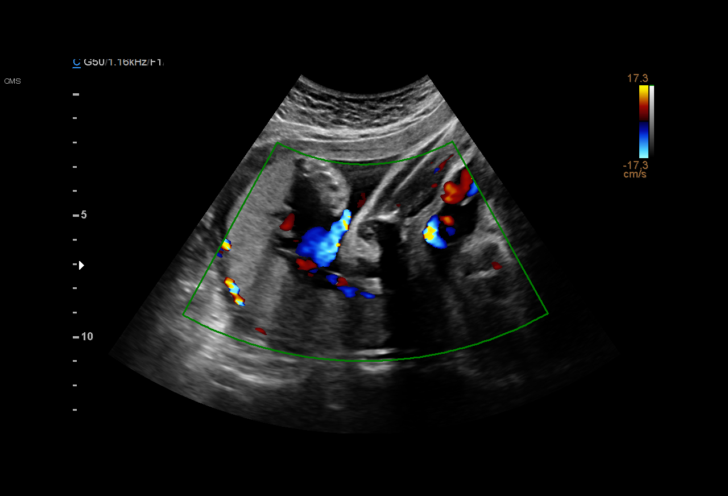
[im 15/57]
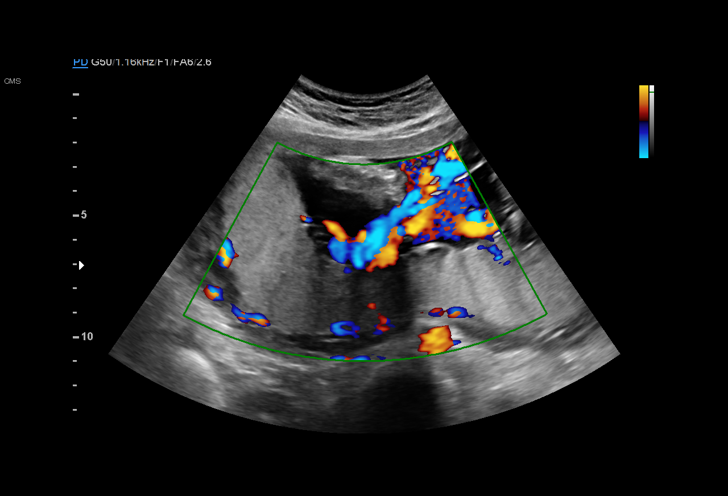
[im 19/57]
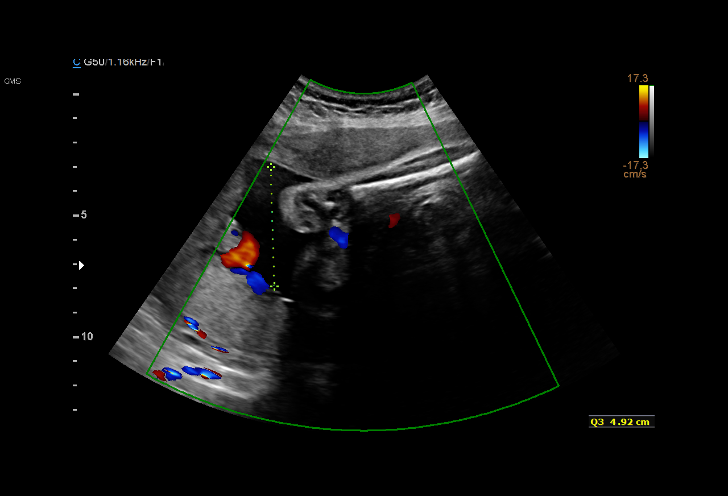
[im 23/57]
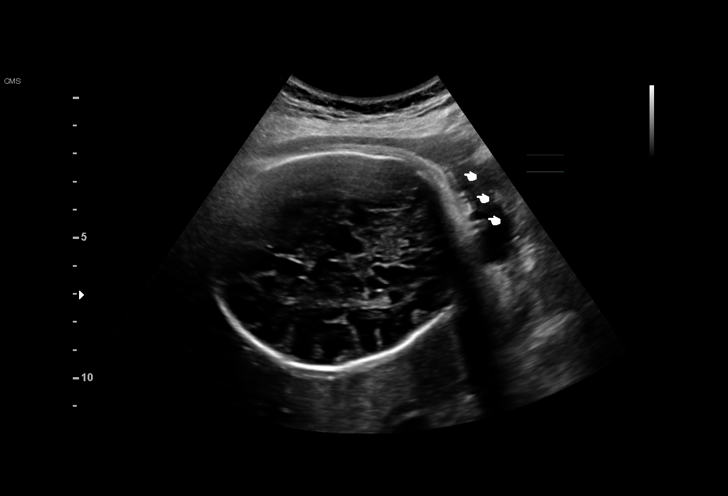
[im 30/57]
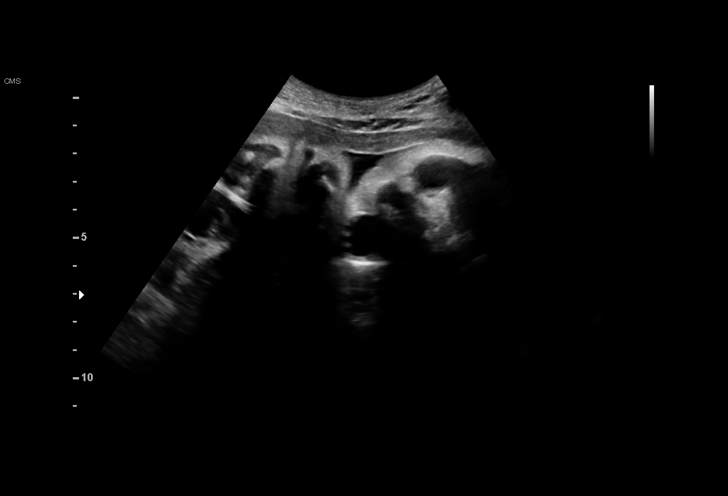
[im 34/57]
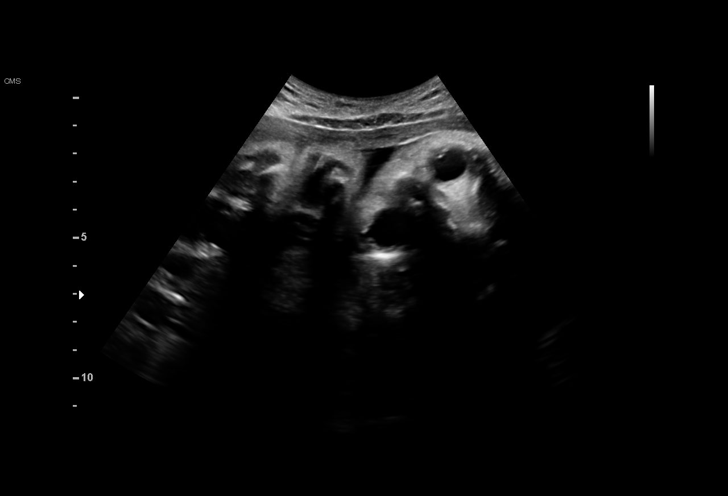
[im 38/57]
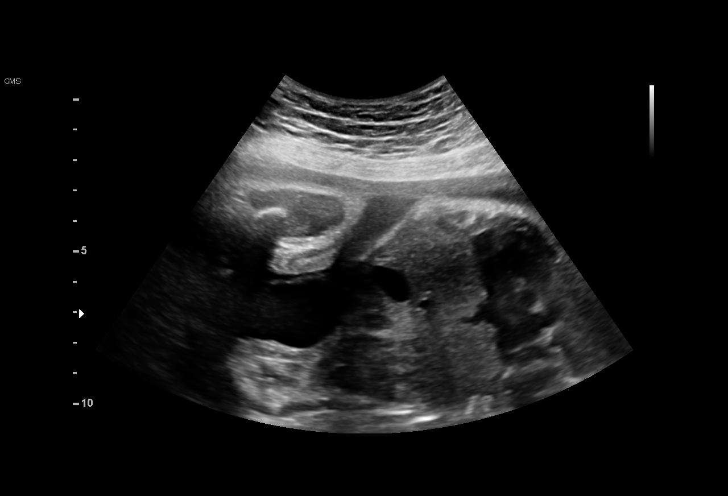
[im 42/57]
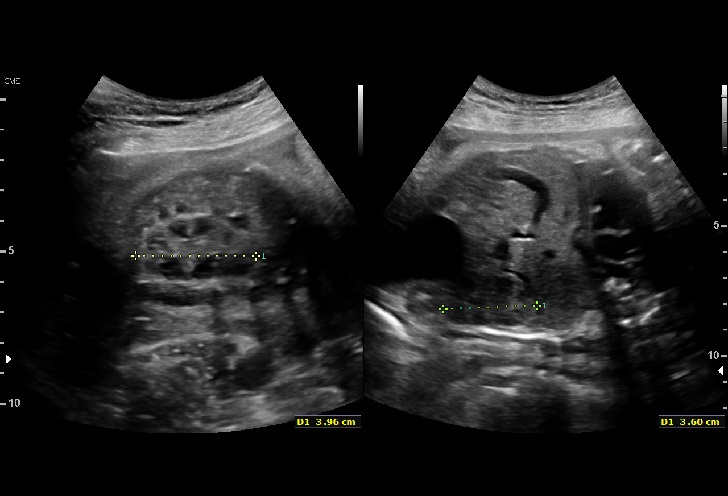
[im 46/57]
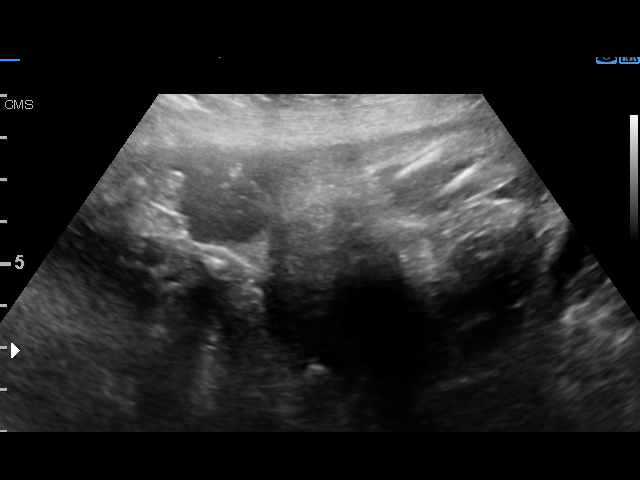
[im 50/57]
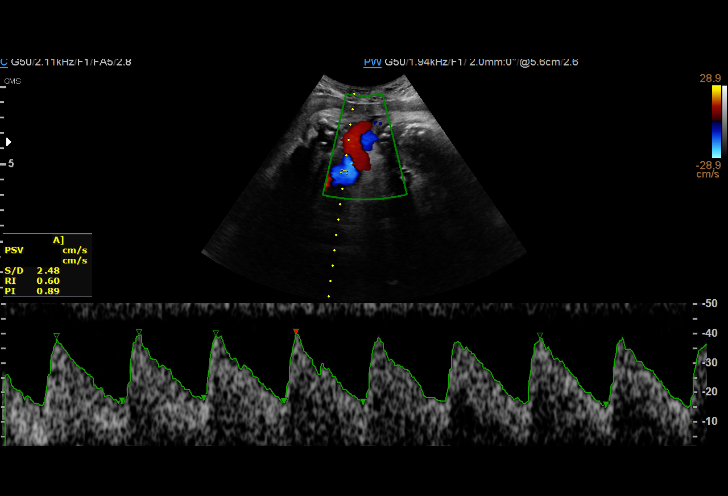
[im 54/57]
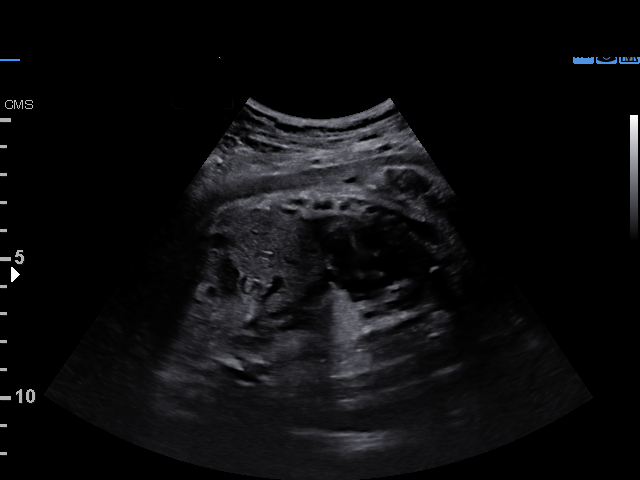

[13 of 28 positions shown; findings below may reference images not displayed]

1  JAYLON AUJLA            [id number]      [id number]     [id number]
2  JAYLON AUJLA            [id number]      [id number]     [id number]
3  JAYLON AUJLA            [id number]      [id number]     [id number]
Indications

32 weeks gestation of pregnancy
Poor obstetric history: Previous preeclampsia
Advanced maternal age multigravida 35,
third trimester - low risk Panorama
OB History

Gravidity:    4         Term:   3        Prem:   0        SAB:   0
TOP:          0       Ectopic:  0        Living: 3
Fetal Evaluation

Num Of Fetuses:     1
Fetal Heart         140
Rate(bpm):
Cardiac Activity:   Observed
Presentation:       Transverse, head to maternal left
Placenta:           Posterior, above cervical os
P. Cord Insertion:  Visualized, central

Amniotic Fluid
AFI FV:      Subjectively within normal limits

AFI Sum(cm)     %Tile       Largest Pocket(cm)
15.76           56

RUQ(cm)       RLQ(cm)       LUQ(cm)        LLQ(cm)
3.63
Biophysical Evaluation

Amniotic F.V:   Within normal limits       F. Tone:        Observed
F. Movement:    Observed                   Score:          [DATE]
F. Breathing:   Observed
Biometry

BPD:      74.5  mm     G. Age:  29w 6d          2  %    CI:        68.15   %   70 - 86
FL/HC:      20.8   %   19.1 -
HC:      288.6  mm     G. Age:  31w 5d         11  %    HC/AC:      1.12       0.96 -
AC:      256.9  mm     G. Age:  29w 6d          5  %    FL/BPD:     80.7   %   71 - 87
FL:       60.1  mm     G. Age:  31w 2d         20  %    FL/AC:      23.4   %   20 - 24
HUM:        57  mm     G. Age:  33w 1d         73  %

Est. FW:    8434  gm      3 lb 8 oz     27  %
Gestational Age

LMP:           27w 0d       Date:   05/27/15                 EDD:   03/02/16
U/S Today:     30w 5d                                        EDD:   02/05/16
Best:          32w 0d    Det. By:   U/S  (10/20/15)          EDD:   01/27/16
Anatomy

Cranium:               Appears normal         Aortic Arch:            Previously seen
Cavum:                 Appears normal         Ductal Arch:            Previously seen
Ventricles:            Appears normal         Diaphragm:              Previously seen
Choroid Plexus:        Appears normal         Stomach:                Appears normal, left
sided
Cerebellum:            Appears normal         Abdomen:                Appears normal
Posterior Fossa:       Appears normal         Abdominal Wall:         Previously seen
Nuchal Fold:           Not applicable (>20    Cord Vessels:           Previously seen
wks GA)
Face:                  Orbits and profile     Kidneys:                Appear normal
previously seen
Lips:                  Previously seen        Bladder:                Appears normal
Thoracic:              Appears normal         Spine:                  Previously seen
Heart:                 Echogenic focus        Upper Extremities:      Previously seen
in LV, NL 4CH
RVOT:                  Appears normal         Lower Extremities:      Previously seen
LVOT:                  Appears normal

Other:  Fetus appears to be a male. Heels and 5th digit previously visualized.
Nasal bone previously  visualized.
Doppler - Fetal Vessels

Umbilical Artery
S/D     %tile     RI              PI              PSV    ADFV    RDFV
(cm/s)
2.45       37   0.59             0.91             33.41      No      No

Cervix Uterus Adnexa

Cervix
Not visualized (advanced GA >63wks)
Uterus
No abnormality visualized.

Left Ovary
Not visualized.

Right Ovary
Not visualized.

Cul De Sac:   No free fluid seen.

Adnexa:       No abnormality visualized.
Impression

Single IUP at 32w 0d
Advanced maternal age - low risk NIPT
The overall estimated fetal weight today is at the 27th %tile.
The AC measures at the 5th %tile (asymmetric growth)
BPP [DATE]
UA Doppler studies normal for gestational age
Normal amniotic fluid volume
Recommendations

Recommend weekly BPPs with UA Doppler studies
Ultrasound for growth in 3 weeks

## 2019-11-24 ENCOUNTER — Emergency Department (HOSPITAL_COMMUNITY)
Admission: EM | Admit: 2019-11-24 | Discharge: 2019-11-24 | Disposition: A | Discharge status: Home / Self Care | Payer: Medicaid Other | Attending: Emergency Medicine | Admitting: Emergency Medicine

## 2019-11-24 ENCOUNTER — Emergency Department (HOSPITAL_COMMUNITY): Payer: Medicaid Other

## 2019-11-24 ENCOUNTER — Other Ambulatory Visit: Payer: Self-pay

## 2019-11-24 ENCOUNTER — Encounter (HOSPITAL_COMMUNITY): Payer: Self-pay | Admitting: Emergency Medicine

## 2019-11-24 DIAGNOSIS — R0602 Shortness of breath: Secondary | ICD-10-CM | POA: Diagnosis present

## 2019-11-24 DIAGNOSIS — M545 Low back pain: Secondary | ICD-10-CM | POA: Diagnosis not present

## 2019-11-24 DIAGNOSIS — U071 COVID-19: Secondary | ICD-10-CM | POA: Diagnosis not present

## 2019-11-24 DIAGNOSIS — Z5321 Procedure and treatment not carried out due to patient leaving prior to being seen by health care provider: Secondary | ICD-10-CM | POA: Insufficient documentation

## 2019-11-24 NOTE — ED Notes (Signed)
Pt called x 3  No answer. 

## 2019-11-24 NOTE — ED Provider Notes (Signed)
Patient not seen on this visit, left without being seen   Zadie Rhine, MD 11/24/19 [phone]

## 2019-11-24 NOTE — ED Triage Notes (Signed)
Pt reports she tested +covid yesterday, c/o shortness of breath, pain with inspiration and back pain.

## 2020-03-12 ENCOUNTER — Other Ambulatory Visit: Payer: Self-pay | Admitting: Family Medicine

## 2020-03-12 DIAGNOSIS — Z1231 Encounter for screening mammogram for malignant neoplasm of breast: Secondary | ICD-10-CM

## 2021-09-22 ENCOUNTER — Other Ambulatory Visit: Payer: Self-pay | Admitting: Anesthesiology

## 2021-09-22 DIAGNOSIS — M545 Low back pain, unspecified: Secondary | ICD-10-CM

## 2021-09-22 DIAGNOSIS — M5136 Other intervertebral disc degeneration, lumbar region: Secondary | ICD-10-CM

## 2022-05-02 ENCOUNTER — Other Ambulatory Visit: Payer: Self-pay

## 2022-05-02 ENCOUNTER — Ambulatory Visit (INDEPENDENT_AMBULATORY_CARE_PROVIDER_SITE_OTHER): Payer: Medicaid Other | Admitting: Internal Medicine

## 2022-05-02 ENCOUNTER — Encounter: Payer: Self-pay | Admitting: Internal Medicine

## 2022-05-02 VITALS — BP 167/103 | HR 89 | Temp 98.1°F | Wt 141.0 lb

## 2022-05-02 DIAGNOSIS — R21 Rash and other nonspecific skin eruption: Secondary | ICD-10-CM

## 2022-05-02 NOTE — Progress Notes (Signed)
[facility]      Reason for Consult:rash    Referring Physician: Dr. Arelia Sneddon    Patient ID: Kara Conner, female    DOB: [birth year], 42 y.o.   MRN: [id number]  HPI:   Kara Conner is here for evaluation of sensation of moving parasites under her skin She reports a long history of facial rash and feeling of movement under her skin since about 2021.  She has been to multiple physicians and has not had any answers.  She describes seeing mites and parasites.  She has pictures and shows multiple pictures she describes as parasites and others with 'mites'.  She reports she can see the head.  She is frustrated with not getting answers. She reports her PCP pulled out two worms from insider her mouth but results sent to the lab were negative for anything.    Past Medical History:  Diagnosis Date   Hypertension    1st pregnancy for preeclampsia    Prior to Admission medications   Medication Sig Start Date End Date Taking? Authorizing Provider  amphetamine-dextroamphetamine (ADDERALL) 20 MG tablet Take 20 mg by mouth 3 (three) times daily. 04/21/22  Yes [provider]  clonazePAM (KLONOPIN) 1 MG tablet Take 1 mg by mouth 2 (two) times daily. 06/19/17  Yes [provider]  DULoxetine (CYMBALTA) 60 MG capsule Take 120 mg by mouth daily. 04/20/22  Yes [provider]  ibuprofen (ADVIL,MOTRIN) 800 MG tablet Take 1 tablet (800 mg total) by mouth every 8 (eight) hours as needed. 08/14/17  Yes Shelly Bombard, MD  oxyCODONE-acetaminophen (PERCOCET) 7.5-325 MG tablet TK 1 T PO  Q 8 H 07/30/17  Yes [provider]  traZODone (DESYREL) 50 MG tablet Take 50 mg by mouth 2 (two) times daily. 07/03/17  Yes [provider]  butalbital-acetaminophen-caffeine (FIORICET, ESGIC) 50-325-40 MG tablet Take by mouth 2 (two) times daily as needed for headache. Patient not taking: Reported on 05/02/2022    [provider]  clindamycin (CLEOCIN-T) 1 %  external solution Apply topically 2 (two) times daily. Patient not taking: Reported on 05/02/2022 08/14/17   Shelly Bombard, MD  clonazePAM (KLONOPIN) 0.5 MG tablet Take 1 tablet (0.5 mg total) by mouth 2 (two) times daily as needed for anxiety. Patient not taking: Reported on 05/02/2022 04/13/16   Constant, Peggy, MD  fluconazole (DIFLUCAN) 200 MG tablet Take 1 tablet (200 mg total) by mouth every 3 (three) days. Patient not taking: Reported on 05/02/2022 08/14/17   Shelly Bombard, MD  hydrocortisone 2.5 % ointment Apply topically 2 (two) times daily. Patient not taking: Reported on 05/02/2022 08/14/17   Shelly Bombard, MD  rizatriptan (MAXALT) 10 MG tablet Take 1 tablet (10 mg total) by mouth as needed for migraine. May repeat in 2 hours if needed Patient not taking: Reported on 05/02/2022 08/14/17   Shelly Bombard, MD    No Known Allergies  Social History   Tobacco Use   Smoking status: Former    Packs/day: 1.00    Years: 20.00    Total pack years: 20.00    Types: Cigarettes    Quit date: 04/21/2005    Years since quitting: 17.0   Smokeless tobacco: Never  Substance Use Topics   Alcohol use: No    Alcohol/week: 0.0 standard drinks of alcohol   Drug use: No    Family History  Problem Relation Age of Onset   Heart disease Father    Hypertension  Father     Review of Systems  Constitutional: negative for fevers and chills All other systems reviewed and are negative    Constitutional: in no apparent distress  Vitals:   05/02/22 1016  BP: (!) 167/103  Pulse: 89  Temp: 98.1 F (36.7 C)  SpO2: 100%   EYES: anicteric Respiratory: normal respiratory effort Musculoskeletal: no edema Skin: face: she has two lesions on her face  Chaperone present - left breast with an abrasion, normal-looking tissue at site she reports are 'heads' of organisms  Labs: Lab Results  Component Value Date   WBC 8.4 01/20/2016   HGB 9.9 (L) 01/20/2016   HCT 29.9 (L) 01/20/2016   MCV 84.9  01/20/2016   PLT 191 01/20/2016    Lab Results  Component Value Date   CREATININE 0.49 01/20/2016   BUN 9 01/20/2016   NA 133 (L) 01/20/2016   K 3.8 01/20/2016   CL 103 01/20/2016   CO2 23 01/20/2016    Lab Results  Component Value Date   ALT 16 01/20/2016   AST 26 01/20/2016   ALKPHOS 180 (H) 01/20/2016   BILITOT 0.7 01/20/2016     Assessment: delusional parasitosis.  I discussed with her at length that the pictures she is showing are most c/w skin cells and I do not see the parasites under her skin that she points to.  She was clearly frustrated.  I recommended she could consider a biopsy but worms as she is describing is not typical without a significant travel history, which she does not have.  I offered support best I could.    Plan: 1)  follow up as needed.

## 2022-05-05 ENCOUNTER — Emergency Department (HOSPITAL_COMMUNITY)
Admission: EM | Admit: 2022-05-05 | Discharge: 2022-05-05 | Disposition: A | Discharge status: Home / Self Care | Payer: Medicaid Other | Attending: Emergency Medicine | Admitting: Emergency Medicine

## 2022-05-05 DIAGNOSIS — Z5321 Procedure and treatment not carried out due to patient leaving prior to being seen by health care provider: Secondary | ICD-10-CM | POA: Insufficient documentation

## 2022-05-05 DIAGNOSIS — H5710 Ocular pain, unspecified eye: Secondary | ICD-10-CM | POA: Insufficient documentation

## 2022-05-05 NOTE — ED Notes (Signed)
No answer for triage x1 

## 2022-10-23 ENCOUNTER — Encounter: Payer: Self-pay | Admitting: Student

## 2022-10-23 ENCOUNTER — Ambulatory Visit: Payer: Medicaid Other | Admitting: Student

## 2022-10-23 VITALS — BP 146/95 | HR 97 | Ht 65.0 in | Wt 136.0 lb

## 2022-10-23 DIAGNOSIS — Z3046 Encounter for surveillance of implantable subdermal contraceptive: Secondary | ICD-10-CM | POA: Diagnosis not present

## 2022-10-23 LAB — POCT URINE PREGNANCY: Preg Test, Ur: NEGATIVE

## 2022-10-23 MED ORDER — ETONOGESTREL 68 MG ~~LOC~~ IMPL
68.0000 mg | DRUG_IMPLANT | Freq: Once | SUBCUTANEOUS | Status: AC
  Administered 2022-10-23: 68 mg via SUBCUTANEOUS

## 2022-10-23 NOTE — Progress Notes (Signed)
 UPT - Negative

## 2022-10-23 NOTE — Progress Notes (Signed)
GYNECOLOGY CLINIC PROCEDURE NOTE Ms. Kara Conner is a 42 y.o. [phone] here for Nexplanon removal and Nexplanon insertion. Last pap smear was on 2019 and was normal.  No other gynecologic concerns.   Nexplanon Removal and Insertion  Patient was given informed consent for removal of her Implanon and insertion of Nexplanon.  Patient does understand that irregular bleeding is a very common side effect of this medication. She was advised to have backup contraception for one week after replacement of the implant. Pregnancy test in clinic today was negative.  Appropriate time out taken. Implanon site identified. Area prepped in usual sterile fashon. One ml of 1% lidocaine was used to anesthetize the area at the distal end of the implant. A small stab incision was made right beside the implant on the distal portion. The Nexplanon rod was grasped using hemostats and removed without difficulty. There was minimal blood loss. There were no complications. Area was then injected with 3 ml of 1 % lidocaine. She was re-prepped with betadine, Nexplanon removed from packaging, Device confirmed in needle, then inserted full length of needle and withdrawn per handbook instructions. Nexplanon was able to palpated in the patient's arm; patient palpated the insert herself.  There was minimal blood loss. Patient insertion site covered with guaze and a pressure bandage to reduce any bruising. The patient tolerated the procedure well and was given post procedure instructions.     Patient to return in 4 weeks for PAP.    Corlis Hove, NP 10/23/2022 1:53 PM

## 2022-12-05 ENCOUNTER — Ambulatory Visit: Payer: Medicaid Other | Admitting: Advanced Practice Midwife
